# Patient Record
Sex: Female | Born: 1970 | Race: White | Hispanic: No | Marital: Married | Smoking: Never smoker
Health system: Southern US, Community
[De-identification: ages and names within clinical notes are randomized; demographics above are authoritative.]

## PROBLEM LIST (undated history)

## (undated) DIAGNOSIS — I639 Cerebral infarction, unspecified: Secondary | ICD-10-CM

## (undated) DIAGNOSIS — G4730 Sleep apnea, unspecified: Secondary | ICD-10-CM

## (undated) DIAGNOSIS — I10 Essential (primary) hypertension: Secondary | ICD-10-CM

---

## 2007-01-05 ENCOUNTER — Emergency Department: Payer: Self-pay | Admitting: Emergency Medicine

## 2013-11-19 HISTORY — PX: WRIST SURGERY: SHX841

## 2014-08-05 ENCOUNTER — Ambulatory Visit: Payer: Self-pay | Admitting: Internal Medicine

## 2014-11-06 ENCOUNTER — Emergency Department: Payer: Self-pay | Admitting: Emergency Medicine

## 2014-11-09 ENCOUNTER — Ambulatory Visit: Payer: Self-pay | Admitting: Orthopedic Surgery

## 2014-11-20 ENCOUNTER — Ambulatory Visit: Payer: Self-pay | Admitting: Internal Medicine

## 2015-03-12 NOTE — Op Note (Signed)
PATIENT NAME:  Rebecca Johnson, Rebecca Johnson MR#:  [id number] DATE OF BIRTH:  04/04/1971  DATE OF PROCEDURE:  11/09/2014  PREOPERATIVE DIAGNOSIS: Right distal radius fracture, displaced.   POSTOPERATIVE DIAGNOSIS: Left distal radius fracture, displaced.   PROCEDURE: Open reduction and internal fixation, right distal radius.   ANESTHESIA: General.   SURGEON: Laurene Footman, MD.  DESCRIPTION OF PROCEDURE: The patient was brought to the Operating Room and after adequate anesthesia was obtained, the right arm was prepped and draped in the usual sterile fashion with a tourniquet applied to the upper arm. The patient identification and timeout procedures were completed, the tourniquet was raised to 250 mmHg.    An incision was made over the FCR tendon. The tendon sheath was incised and the tendon retracted radially to protect the radial artery and veins. The pronator was then elevated off the distal radius fracture as well as shaft. Additionally, finger trap traction had been applied to restore length. With checking out and checking position of the fracture at this point, in length it was restored, volar tilt was not and so a distal first technique was utilized. The short, narrow DVR plate was applied to the  fracture fragments with a K wire into the distal fragment and initial single peg. After these were placed, the 3 cortical screws were placed, drilling and then placing three 10 mm cortical screws. This gave restoration of volar tilt.  The K wire was removed and remaining distal pegs filled, drilling, measuring and placing the smooth pegs. After all smooth pegs had been placed, traction was released under fluoroscopy.  The fracture was stable.  Radial inclination, volar tilt and length were all restored. The wound was thoroughly irrigated. Tourniquet was let down. The wound was closed with 3-0 Vicryl subcutaneously and 4-0 nylon for the skin. Xeroform, 4 x 4, Webril and Ace wrap were applied along with a volar splint.  The patient tolerated the procedure well.   ESTIMATED BLOOD LOSS: Minimal.   COMPLICATIONS: None.   SPECIMEN: None.   IMPLANT: [employer] short, narrow DVR plate with screws and pegs.   TOURNIQUET TIME:  28 minutes at 250 mmHg.    ____________________________ Laurene Footman, MD mjm:by D: 11/09/2014 20:24:06 ET T: 11/09/2014 21:51:23 ET JOB#: [id number]  cc: Laurene Footman, MD, <Dictator> Laurene Footman MD ELECTRONICALLY SIGNED 11/10/2014 8:17

## 2015-09-30 ENCOUNTER — Ambulatory Visit: Payer: 59 | Attending: Internal Medicine

## 2015-09-30 ENCOUNTER — Other Ambulatory Visit: Payer: Self-pay | Admitting: Obstetrics and Gynecology

## 2015-09-30 DIAGNOSIS — G4733 Obstructive sleep apnea (adult) (pediatric): Secondary | ICD-10-CM | POA: Insufficient documentation

## 2015-09-30 DIAGNOSIS — E669 Obesity, unspecified: Secondary | ICD-10-CM | POA: Diagnosis present

## 2015-09-30 DIAGNOSIS — R5383 Other fatigue: Secondary | ICD-10-CM | POA: Diagnosis present

## 2015-09-30 DIAGNOSIS — Z1231 Encounter for screening mammogram for malignant neoplasm of breast: Secondary | ICD-10-CM

## 2015-10-24 ENCOUNTER — Ambulatory Visit
Admission: RE | Admit: 2015-10-24 | Discharge: 2015-10-24 | Disposition: A | Discharge status: Home / Self Care | Payer: 59 | Source: Ambulatory Visit | Attending: Obstetrics and Gynecology | Admitting: Obstetrics and Gynecology

## 2015-10-24 DIAGNOSIS — Z1231 Encounter for screening mammogram for malignant neoplasm of breast: Secondary | ICD-10-CM | POA: Diagnosis not present

## 2015-11-18 ENCOUNTER — Ambulatory Visit: Payer: 59 | Attending: Internal Medicine

## 2015-11-18 DIAGNOSIS — G4733 Obstructive sleep apnea (adult) (pediatric): Secondary | ICD-10-CM | POA: Insufficient documentation

## 2016-02-01 ENCOUNTER — Ambulatory Visit
Admission: RE | Admit: 2016-02-01 | Discharge: 2016-02-01 | Disposition: A | Discharge status: Home / Self Care | Payer: 59 | Source: Ambulatory Visit | Attending: Unknown Physician Specialty | Admitting: Unknown Physician Specialty

## 2016-02-01 ENCOUNTER — Other Ambulatory Visit: Payer: Self-pay | Admitting: Unknown Physician Specialty

## 2016-02-01 DIAGNOSIS — G4459 Other complicated headache syndrome: Secondary | ICD-10-CM

## 2016-02-01 DIAGNOSIS — I639 Cerebral infarction, unspecified: Secondary | ICD-10-CM | POA: Diagnosis not present

## 2016-02-01 DIAGNOSIS — I63532 Cerebral infarction due to unspecified occlusion or stenosis of left posterior cerebral artery: Secondary | ICD-10-CM | POA: Diagnosis not present

## 2016-02-02 ENCOUNTER — Emergency Department: Payer: 59

## 2016-02-02 ENCOUNTER — Other Ambulatory Visit: Payer: Self-pay | Admitting: Physician Assistant

## 2016-02-02 ENCOUNTER — Inpatient Hospital Stay
Admission: EM | Admit: 2016-02-02 | Discharge: 2016-02-03 | DRG: 066 | Disposition: A | Discharge status: Home / Self Care | Payer: 59 | Attending: Internal Medicine | Admitting: Internal Medicine

## 2016-02-02 ENCOUNTER — Encounter: Payer: Self-pay | Admitting: Emergency Medicine

## 2016-02-02 DIAGNOSIS — H539 Unspecified visual disturbance: Secondary | ICD-10-CM

## 2016-02-02 DIAGNOSIS — I639 Cerebral infarction, unspecified: Secondary | ICD-10-CM

## 2016-02-02 DIAGNOSIS — H53461 Homonymous bilateral field defects, right side: Secondary | ICD-10-CM | POA: Diagnosis present

## 2016-02-02 DIAGNOSIS — I6350 Cerebral infarction due to unspecified occlusion or stenosis of unspecified cerebral artery: Secondary | ICD-10-CM | POA: Diagnosis not present

## 2016-02-02 DIAGNOSIS — G4733 Obstructive sleep apnea (adult) (pediatric): Secondary | ICD-10-CM | POA: Diagnosis present

## 2016-02-02 DIAGNOSIS — I10 Essential (primary) hypertension: Secondary | ICD-10-CM | POA: Diagnosis present

## 2016-02-02 DIAGNOSIS — Z79899 Other long term (current) drug therapy: Secondary | ICD-10-CM

## 2016-02-02 DIAGNOSIS — I63532 Cerebral infarction due to unspecified occlusion or stenosis of left posterior cerebral artery: Principal | ICD-10-CM | POA: Diagnosis present

## 2016-02-02 HISTORY — DX: Essential (primary) hypertension: I10

## 2016-02-02 HISTORY — DX: Sleep apnea, unspecified: G47.30

## 2016-02-02 LAB — COMPREHENSIVE METABOLIC PANEL
ALT: 12 U/L — ABNORMAL LOW (ref 14–54)
ANION GAP: 6 (ref 5–15)
AST: 13 U/L — ABNORMAL LOW (ref 15–41)
Albumin: 3.8 g/dL (ref 3.5–5.0)
Alkaline Phosphatase: 47 U/L (ref 38–126)
BUN: 10 mg/dL (ref 6–20)
CALCIUM: 8 mg/dL — AB (ref 8.9–10.3)
CHLORIDE: 109 mmol/L (ref 101–111)
CO2: 21 mmol/L — AB (ref 22–32)
Creatinine, Ser: 0.6 mg/dL (ref 0.44–1.00)
GFR calc non Af Amer: >60 mL/min (ref >60)
GLUCOSE: 94 mg/dL (ref 65–99)
Potassium: 3.9 mmol/L (ref 3.5–5.1)
SODIUM: 136 mmol/L (ref 135–145)
Total Bilirubin: 0.6 mg/dL (ref 0.3–1.2)
Total Protein: 6.8 g/dL (ref 6.5–8.1)

## 2016-02-02 LAB — CBC WITH DIFFERENTIAL/PLATELET
Basophils Absolute: 0.1 10*3/uL (ref 0–0.1)
Basophils Relative: 1 %
EOS ABS: 0.1 10*3/uL (ref 0–0.7)
EOS PCT: 1 %
HCT: 40.1 % (ref 35.0–47.0)
Hemoglobin: 13.4 g/dL (ref 12.0–16.0)
LYMPHS ABS: 3.6 10*3/uL (ref 1.0–3.6)
Lymphocytes Relative: 29 %
MCH: 29 pg (ref 26.0–34.0)
MCHC: 33.3 g/dL (ref 32.0–36.0)
MCV: 87.2 fL (ref 80.0–100.0)
MONO ABS: 0.8 10*3/uL (ref 0.2–0.9)
MONOS PCT: 6 %
Neutro Abs: 7.9 10*3/uL — ABNORMAL HIGH (ref 1.4–6.5)
Neutrophils Relative %: 63 %
PLATELETS: 317 10*3/uL (ref 150–440)
RBC: 4.6 MIL/uL (ref 3.80–5.20)
RDW: 14.3 % (ref 11.5–14.5)
WBC: 12.5 10*3/uL — AB (ref 3.6–11.0)

## 2016-02-02 MED ORDER — ASPIRIN 325 MG PO TABS
325.0000 mg | ORAL_TABLET | Freq: Every day | ORAL | Status: DC
  Administered 2016-02-03: 08:00:00 325 mg via ORAL
  Filled 2016-02-02: qty 1

## 2016-02-02 MED ORDER — ASPIRIN 81 MG PO CHEW
81.0000 mg | CHEWABLE_TABLET | Freq: Once | ORAL | Status: AC
  Administered 2016-02-02: 81 mg via ORAL
  Filled 2016-02-02: qty 1

## 2016-02-02 MED ORDER — LORAZEPAM 2 MG/ML IJ SOLN
1.0000 mg | Freq: Once | INTRAMUSCULAR | Status: AC
  Administered 2016-02-02: 1 mg via INTRAVENOUS

## 2016-02-02 MED ORDER — PROPRANOLOL HCL 40 MG PO TABS
40.0000 mg | ORAL_TABLET | Freq: Two times a day (BID) | ORAL | Status: DC
  Filled 2016-02-02: qty 1

## 2016-02-02 MED ORDER — ASPIRIN 300 MG RE SUPP
300.0000 mg | Freq: Every day | RECTAL | Status: DC
Start: 2016-02-02 — End: 2016-02-03

## 2016-02-02 MED ORDER — ATORVASTATIN CALCIUM 20 MG PO TABS
80.0000 mg | ORAL_TABLET | Freq: Every day | ORAL | Status: DC
  Administered 2016-02-03: 18:00:00 80 mg via ORAL
  Filled 2016-02-02: qty 4

## 2016-02-02 MED ORDER — LORAZEPAM 2 MG/ML IJ SOLN
INTRAMUSCULAR | Status: AC
  Filled 2016-02-02: qty 1

## 2016-02-02 MED ORDER — STROKE: EARLY STAGES OF RECOVERY BOOK
Freq: Once | Status: AC
  Administered 2016-02-02: 1

## 2016-02-02 NOTE — ED Notes (Signed)
Pt here after visiting PCP and eye MD today; pt with loss of peripheral vision since yesterday; pt denies it's getting worse. Pt's eyes are dilated from eye doctor, unsure if blurred vision is from dilation or new symptom. Pt reports small headache, reports she's stressed out and hasn't taken her medications today.

## 2016-02-02 NOTE — ED Notes (Signed)
Spoke with Dr Lavetta Nielsen regarding aspirin given in ER.  Stated to start 325 aspirin in AM.  Notified Kasey, RN on 1C.

## 2016-02-02 NOTE — ED Provider Notes (Addendum)
[facility] Emergency Department Provider Note   ____________________________________________  Time seen: Upon arrival to ED room I have reviewed the triage vital signs and the triage nursing note.  HISTORY  Chief Complaint Blurred Vision   Historian Patient  HPI Rebecca Johnson is a 45 y.o. female who is here for evaluation of peripheral field vision loss. Patient states yesterday she woke up and experienced right-sided peripheral vision loss. She went to urgent care when they opened around 9 AM and was sent for head CT which was read as negative. Patient was discharged home and made an appointment for ophthalmology this morning. Symptoms persisted and when she saw the ophthalmologist today, she had a dilated eye exam and there were apparently no concerning findings on that exam, other than peripheral vision field deficits bilaterally, reported by the patient. Patient was sent by ophthalmology to the emergency department for further evaluation for possibility of stroke.  Last normal would have been the evening of 01/31/16.  She is currently reporting blurry vision and sensitivity to light, and she has her eyes dilated.  No additional neurologic symptoms including no extremity weakness or numbness, slurred speech, coordination problems, sensory disturbances.    Past Medical History  Diagnosis Date  . Hypertension   . Sleep apnea     There are no active problems to display for this patient.   Past Surgical History  Procedure Laterality Date  . Wrist surgery Right 2015    No current outpatient prescriptions on file.  Allergies Review of patient's allergies indicates no known allergies.  Family History  Problem Relation Age of Onset  . Breast cancer Maternal Grandmother 80    Social History Social History  Substance Use Topics  . Smoking status: Never Smoker   . Smokeless tobacco: None  . Alcohol Use: No    Review of  Systems  Constitutional: Negative for fever. Eyes: As per history of present illness ENT: Negative for sore throat. Cardiovascular: Negative for chest pain. Respiratory: Negative for shortness of breath. Gastrointestinal: Negative for abdominal pain, vomiting and diarrhea. Genitourinary: Negative for dysuria. Musculoskeletal: Negative for back pain. Skin: Negative for rash. Neurological: Negative for headache. 10 point Review of Systems otherwise negative ____________________________________________   PHYSICAL EXAM:  VITAL SIGNS: ED Triage Vitals  Enc Vitals Group     BP 02/02/16 1614 212/113 mmHg     Pulse Rate 02/02/16 1614 77     Resp 02/02/16 1614 16     Temp 02/02/16 1614 97.6 F (36.4 C)     Temp Source 02/02/16 1614 Oral     SpO2 02/02/16 1614 99 %     Weight 02/02/16 1614 270 lb (122.471 kg)     Height 02/02/16 1614 5\' 2"  (1.575 m)     Head Cir --      Peak Flow --      Pain Score 02/02/16 1615 1     Pain Loc --      Pain Edu? --      Excl. in [place]? --      Constitutional: Alert and oriented. Well appearing and in no distress. HEENT   Head: Normocephalic and atraumatic.      Eyes: Conjunctivae are normal. Pupils are dilated bilaterally. Normal extraocular movements.      Ears:         Nose: No congestion/rhinnorhea.   Mouth/Throat: Mucous membranes are moist.   Neck: No stridor. Cardiovascular/Chest: Normal rate, regular rhythm.  No murmurs, rubs, or gallops. Respiratory:  Normal respiratory effort without tachypnea nor retractions. Breath sounds are clear and equal bilaterally. No wheezes/rales/rhonchi. Gastrointestinal: Soft. No distention, no guarding, no rebound. Nontender.    Genitourinary/rectal:Deferred Musculoskeletal: Nontender with normal range of motion in all extremities. No joint effusions.  No lower extremity tenderness.  No edema. Neurologic:  Normal speech and language. No facial droop. No aphasia. 5 out of 5 strength in 4 extremities.  No sensory deficit. No problems with coordination and normal gait. Skin:  Skin is warm, dry and intact. No rash noted. Psychiatric: Mood and affect are normal. Speech and behavior are normal. Patient exhibits appropriate insight and judgment.  ____________________________________________   EKG I, Lisa Roca, MD, the attending physician have personally viewed and interpreted all ECGs.  74 bpm. normal sinus rhythm. Nonspecific intraventricular conduction delay. Normal axis. Nonspecific T-wave ____________________________________________  LABS (pertinent positives/negatives)  White blood count 12.5, hemoglobin 13.4 and platelet count 317 and comprehensive metabolic panel without significant abnormality  ____________________________________________  RADIOLOGY All Xrays were viewed by me. Imaging interpreted by Radiologist.  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 __________________________________________  PROCEDURES  Procedure(s) performed: None  Critical Care performed: None  ____________________________________________   ED COURSE / ASSESSMENT AND PLAN  Pertinent labs & imaging results that were available during my care of the patient were reviewed by me and considered in my medical decision making (see chart for details).   Patient is here with bilateral peripheral vision field deficits.  She had a head CT which was read as negative yesterday, I will send her for MRI.  This is somewhat concerning for stroke, but she is outside any TPA window given onset was well over 24 hours ago.   MRI confirms a small stroke. Patient was given aspirin and I discussed this case with the hospitalist for admission.    CONSULTATIONS:   Hospitalist for admission   Patient / Family / Caregiver informed of clinical course, medical decision-making  process, and agree with plan.     ___________________________________________   FINAL CLINICAL IMPRESSION(S) / ED DIAGNOSES   Final diagnoses:  Cerebrovascular accident (CVA), unspecified mechanism ([place])              Note: This dictation was prepared with Sales executive. Any transcriptional errors that result from this process are unintentional   Lisa Roca, MD 02/02/16 1854  Lisa Roca, MD 02/02/16 [id number]  Lisa Roca, MD 02/02/16 1904

## 2016-02-02 NOTE — ED Notes (Signed)
Pt to mri 

## 2016-02-02 NOTE — H&P (Signed)
[facility] NAME: Rebecca Johnson    MR#:  [id number]  DATE OF BIRTH:  [birth year]   DATE OF ADMISSION:  02/02/2016  PRIMARY CARE PHYSICIAN: Kirk Ruths., MD   REQUESTING/REFERRING PHYSICIAN: Reita Cliche  CHIEF COMPLAINT:   Chief Complaint  Patient presents with  . Blurred Vision    HISTORY OF PRESENT ILLNESS:  Rebecca Johnson  is a 45 y.o. female with a known history of essential hypertension. She is presenting to the hospital with one-day duration of blurred vision. Last known normal approximately 7 AM 02/02/16. Went to urgent care and advised to follow with ophthalmology who felt that she had a stroke and sent to the Hospital further workup and evaluation. Where she indeed had MRI positive for stroke. She says since she has a right-sided vision loss most noticeable in her right eye denies any weakness or numbness. States her vision has somewhat improved since onset but still not at baseline  PAST MEDICAL HISTORY:   Past Medical History  Diagnosis Date  . Hypertension   . Sleep apnea     PAST SURGICAL HISTORY:   Past Surgical History  Procedure Laterality Date  . Wrist surgery Right 2015    SOCIAL HISTORY:   Social History  Substance Use Topics  . Smoking status: Never Smoker   . Smokeless tobacco: Not on file  . Alcohol Use: No    FAMILY HISTORY:   Family History  Problem Relation Age of Onset  . Breast cancer Maternal Grandmother 80    DRUG ALLERGIES:  No Known Allergies  REVIEW OF SYSTEMS:  REVIEW OF SYSTEMS:  CONSTITUTIONAL: Denies fevers, chills, fatigue, weakness.  EYES: Positive blurred vision, denies double vision, or eye pain.  EARS, NOSE, THROAT: Denies tinnitus, ear pain, hearing loss.  RESPIRATORY: denies cough, shortness of breath, wheezing  CARDIOVASCULAR: Denies chest pain, palpitations, edema.  GASTROINTESTINAL: Denies nausea, vomiting, diarrhea, abdominal pain.  GENITOURINARY:  Denies dysuria, hematuria.  ENDOCRINE: Denies nocturia or thyroid problems. HEMATOLOGIC AND LYMPHATIC: Denies easy bruising or bleeding.  SKIN: Denies rash or lesions.  MUSCULOSKELETAL: Denies pain in neck, back, shoulder, knees, hips, or further arthritic symptoms.  NEUROLOGIC: Denies paralysis, paresthesias.  PSYCHIATRIC: Denies anxiety or depressive symptoms. Otherwise full review of systems performed by me is negative.   MEDICATIONS AT HOME:   Prior to Admission medications   Medication Sig Start Date End Date Taking? Authorizing Provider  Butalbital-APAP-Caffeine 50-325-40 MG capsule Take 1 capsule by mouth every 4 (four) hours as needed.   Yes Historical Provider, MD  propranolol (INDERAL) 40 MG tablet Take 40 mg by mouth 2 (two) times daily.   Yes Historical Provider, MD      VITAL SIGNS:  Blood pressure 182/120, pulse 77, temperature 97.6 F (36.4 C), temperature source Oral, resp. rate 16, height 5\' 2"  (1.575 m), weight 270 lb (122.471 kg), last menstrual period 01/16/2016, SpO2 98 %.  PHYSICAL EXAMINATION:  VITAL SIGNS: Filed Vitals:   02/02/16 1614 02/02/16 1930  BP: 212/113 182/120  Pulse: 77   Temp: 97.6 F (36.4 C)   Resp: 16 16   GENERAL:44 y.o.female currently in no acute distress.  HEAD: Normocephalic, atraumatic.  EYES: Pupils equal, round, reactive to light. Extraocular muscles intact. No scleral icterus.  MOUTH: Moist mucosal membrane. Dentition intact. No abscess noted.  EAR, NOSE, THROAT: Clear without exudates. No external lesions.  NECK: Supple. No thyromegaly. No nodules. No JVD.  PULMONARY: Clear to ascultation, without wheeze  rails or rhonci. No use of accessory muscles, Good respiratory effort. good air entry bilaterally CHEST: Nontender to palpation.  CARDIOVASCULAR: S1 and S2. Regular rate and rhythm. No murmurs, rubs, or gallops. No edema. Pedal pulses 2+ bilaterally.  GASTROINTESTINAL: Soft, nontender, nondistended. No masses. Positive bowel  sounds. No hepatosplenomegaly.  MUSCULOSKELETAL: No swelling, clubbing, or edema. Range of motion full in all extremities.  NEUROLOGIC: Right-sided field visual deficit of both eyes, remainder of cranial nerves within normal limits, pronator drift within normal limits, strength 5/5 in all extremities including proximal/distal flexion/extension sensation intact reflexes intact gait deferred at this time SKIN: No ulceration, lesions, rashes, or cyanosis. Skin warm and dry. Turgor intact.  PSYCHIATRIC: Mood, affect within normal limits. The patient is awake, alert and oriented x 3. Insight, judgment intact.    LABORATORY PANEL:   CBC  Recent Labs Lab 02/02/16 1748  WBC 12.5*  HGB 13.4  HCT 40.1  PLT 317   ------------------------------------------------------------------------------------------------------------------  Chemistries   Recent Labs Lab 02/02/16 1748  NA 136  K 3.9  CL 109  CO2 21*  GLUCOSE 94  BUN 10  CREATININE 0.60  CALCIUM 8.0*  AST 13*  ALT 12*  ALKPHOS 47  BILITOT 0.6   ------------------------------------------------------------------------------------------------------------------  Cardiac Enzymes No results for input(s): TROPONINI in the last 168 hours. ------------------------------------------------------------------------------------------------------------------  RADIOLOGY:  Ct Head Wo Contrast  02/01/2016  CLINICAL DATA:  45 year old female with a history of headache with visual changes. EXAM: CT HEAD WITHOUT CONTRAST TECHNIQUE: Contiguous axial images were obtained from the base of the skull through the vertex without intravenous contrast. COMPARISON:  None. FINDINGS: Unremarkable appearance of the calvarium without acute fracture or aggressive lesion. Unremarkable appearance of the scalp soft tissues. Unremarkable appearance of the bilateral orbits. Mastoid air cells are clear. No significant paranasal sinus disease No acute intracranial  hemorrhage, midline shift, or mass effect. Gray-white differentiation is maintained, without CT evidence of acute ischemia. Unremarkable configuration of the ventricles. IMPRESSION: No CT evidence of acute intracranial abnormality. Signed, Dulcy Fanny. Earleen Newport, DO [facility] Electronically Signed   By: Corrie Mckusick D.O.   On: 02/01/2016 09:45   Mr Brain Wo Contrast (neuro Protocol)  02/02/2016  CLINICAL DATA:  Right eye visual loss for 1 day. Generalized headaches for a few years. EXAM: MRI HEAD WITHOUT CONTRAST TECHNIQUE: Multiplanar, multiecho pulse sequences of the brain and surrounding structures were obtained without intravenous contrast. COMPARISON:  Head CT 02/01/2016 FINDINGS: There is an acute infarct involving the mesial left temporal lobe white matter along the lateral ventricle which is approximately 3 x 1 cm in size. This involves the posterior choroidal artery territory. There is no evidence of associated hemorrhage. The hippocampus itself is normal in appearance. Ventricles and sulci are normal. 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 The brain is otherwise normal in signal. Orbits are unremarkable. Paranasal sinuses and mastoid air cells are clear. Major intracranial vascular flow voids are preserved. 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 Electronically Signed   By: Logan Bores M.D.   On: 02/02/2016 18:46    EKG:   Orders placed or performed during the hospital encounter of 02/02/16  . EKG 12-Lead  . EKG 12-Lead  . ED EKG  . ED EKG    IMPRESSION AND PLAN:   45 year old Caucasian female history of essential hypertension  presenting with vision loss  1.CVA, unspecified: Initiate aspirin/statin therapy, stroke  protocol/precautions, place on telemetry, trend cardiac enzymes, check noted positive MRI brain, complete echocardiogram, lipid panel, hemoglobin A1c, bilateral carotid Doppler, permissive hypertension first 24 hours treating blood pressure only greater than 220/120, will avoid heparin/Lovenox first 24 hours 2. Essential hypertension: Permissive hypertension as above 3. Venous thromboembolism prophylactic: SCDs overnight and switch to Lovenox in the morning     All the records are reviewed and case discussed with ED provider. Management plans discussed with the patient, family and they are in agreement.  CODE STATUS: Full  TOTAL TIME TAKING CARE OF THIS PATIENT: 33 minutes.    Hower,  Karenann Cai.D on 02/02/2016 at 8:16 PM  Between 7am to 6pm - Pager - [phone]  After 6pm: House Pager: - [facility]  Office  [phone]  CC: Primary care physician; Kirk Ruths., MD

## 2016-02-03 ENCOUNTER — Ambulatory Visit: Payer: 59

## 2016-02-03 ENCOUNTER — Inpatient Hospital Stay: Payer: 59

## 2016-02-03 ENCOUNTER — Inpatient Hospital Stay (HOSPITAL_COMMUNITY)
Admit: 2016-02-03 | Discharge: 2016-02-03 | Disposition: A | Discharge status: Home / Self Care | Payer: 59 | Attending: Internal Medicine | Admitting: Internal Medicine

## 2016-02-03 DIAGNOSIS — I6350 Cerebral infarction due to unspecified occlusion or stenosis of unspecified cerebral artery: Secondary | ICD-10-CM

## 2016-02-03 DIAGNOSIS — I639 Cerebral infarction, unspecified: Secondary | ICD-10-CM

## 2016-02-03 LAB — LIPID PANEL
CHOL/HDL RATIO: 3.6 ratio
Cholesterol: 157 mg/dL (ref 0–200)
HDL: 44 mg/dL (ref >40)
LDL CALC: 100 mg/dL — AB (ref 0–99)
Triglycerides: 66 mg/dL (ref <150)
VLDL: 13 mg/dL (ref 0–40)

## 2016-02-03 LAB — ECHOCARDIOGRAM COMPLETE
HEIGHTINCHES: 62 in
Weight: 4406.4 oz

## 2016-02-03 LAB — TSH: TSH: 1.636 u[IU]/mL (ref 0.350–4.500)

## 2016-02-03 LAB — SEDIMENTATION RATE: SED RATE: 9 mm/h (ref 0–20)

## 2016-02-03 LAB — HEMOGLOBIN A1C: Hgb A1c MFr Bld: 5.5 % (ref 4.0–6.0)

## 2016-02-03 MED ORDER — PROPRANOLOL HCL 40 MG PO TABS
40.0000 mg | ORAL_TABLET | ORAL | Status: AC
  Administered 2016-02-03: 40 mg via ORAL
  Filled 2016-02-03: qty 1

## 2016-02-03 MED ORDER — ENOXAPARIN SODIUM 40 MG/0.4ML ~~LOC~~ SOLN: 40.0000 mg | SUBCUTANEOUS | Status: DC

## 2016-02-03 MED ORDER — ASPIRIN 325 MG PO TABS: 325.0000 mg | ORAL_TABLET | Freq: Every day | ORAL | Status: AC

## 2016-02-03 MED ORDER — ATORVASTATIN CALCIUM 40 MG PO TABS: 40.0000 mg | ORAL_TABLET | Freq: Every day | ORAL | Status: AC

## 2016-02-03 MED ORDER — ASPIRIN EC 81 MG PO TBEC: 81.0000 mg | DELAYED_RELEASE_TABLET | Freq: Every day | ORAL | Status: DC

## 2016-02-03 MED ORDER — PERFLUTREN LIPID MICROSPHERE
INTRAVENOUS | Status: AC
  Administered 2016-02-03: 13:00:00
  Filled 2016-02-03: qty 10

## 2016-02-03 NOTE — Discharge Summary (Signed)
[facility] [place] NAME: Rebecca Johnson    MR#:  [id number]  DATE OF BIRTH:  [birth year]  DATE OF ADMISSION:  02/02/2016 ADMITTING PHYSICIAN: Rebecca Butte, MD  DATE OF DISCHARGE: 02/03/16  PRIMARY CARE PHYSICIAN: Rebecca Johnson., MD    ADMISSION DIAGNOSIS:  Cerebrovascular accident (CVA), unspecified mechanism ([place]) [I63.9]  DISCHARGE DIAGNOSIS:  Active Problems:   CVA (cerebral infarction)   SECONDARY DIAGNOSIS:   Past Medical History  Diagnosis Date  . Hypertension   . Sleep apnea     HOSPITAL COURSE:   [name] with PMH of HTN admitted with right homonymous hemoanopsia. MRI showing an acute infarct.  #1 Acute infarct- small acute left PCA infarct - symptoms improved, minimal right sided peripheral vision changes seen - seen by speech therapy and physical therapy and occupational therapy- and patient does not qualify for any rehab. -Possible workup has been sent. -Blood pressure is better controlled now. -Carotid Dopplers with no occlusion. Echocardiogram results are pending. If that is normal, cardiology appointment has been set up for outpatient TEE. -Started on aspirin and statin- LDL 100 -Outpatient neurology follow-up recommended. Outpatient ophthalmology follow-up as well.  #2 HTN- continue propranolol.   #3 obstructive sleep apnea-continue CPAP  Patient will be discharged home today  DISCHARGE CONDITIONS:   Stable  CONSULTS OBTAINED:  Treatment Team:  Rebecca Butte, MD Rebecca Hartshorn, MD  DRUG ALLERGIES:  No Known Allergies  DISCHARGE MEDICATIONS:   Current Discharge Medication List    START taking these medications   Details  aspirin EC 81 MG tablet Take 1 tablet (81 mg total) by mouth daily. Qty: 30 tablet, Refills: 2    atorvastatin (LIPITOR) 40 MG tablet Take 1 tablet (40 mg total) by mouth daily at 6 PM. Qty: 30 tablet, Refills: 2      CONTINUE these medications which have  NOT CHANGED   Details  Butalbital-APAP-Caffeine 50-325-40 MG capsule Take 1 capsule by mouth every 4 (four) hours as needed. Refills: 0    propranolol (INDERAL) 40 MG tablet Take 40 mg by mouth 2 (two) times daily.         DISCHARGE INSTRUCTIONS:   1. PCP f/u in 1-2 weeks 2. Cardiology f/u in 1 week for possible TEE 3. Neurology f/u in 3 weeks  If you experience worsening of your admission symptoms, develop shortness of breath, life threatening emergency, suicidal or homicidal thoughts you must seek medical attention immediately by calling 911 or calling your MD immediately  if symptoms less severe.  You Must read complete instructions/literature along with all the possible adverse reactions/side effects for all the Medicines you take and that have been prescribed to you. Take any new Medicines after you have completely understood and accept all the possible adverse reactions/side effects.   Please note  You were cared for by a hospitalist during your hospital stay. If you have any questions about your discharge medications or the care you received while you were in the hospital after you are discharged, you can call the unit and asked to speak with the hospitalist on call if the hospitalist that took care of you is not available. Once you are discharged, your primary care physician will handle any further medical issues. Please note that NO REFILLS for any discharge medications will be authorized once you are discharged, as it is imperative that you return to your primary care physician (or establish a relationship with a primary care physician if you  do not have one) for your aftercare needs so that they can reassess your need for medications and monitor your lab values.    Today   CHIEF COMPLAINT:   Chief Complaint  Patient presents with  . Blurred Vision     VITAL SIGNS:  Blood pressure 146/88, pulse 92, temperature 99.3 F (37.4 C), temperature source Oral, resp. rate 24,  height 5\' 2"  (1.575 m), weight 124.921 kg (275 lb 6.4 oz), last menstrual period 01/16/2016, SpO2 97 %.  I/O:   Intake/Output Summary (Last 24 hours) at 02/03/16 1553 Last data filed at 02/03/16 1300  Gross per 24 hour  Intake    480 ml  Output      0 ml  Net    480 ml    PHYSICAL EXAMINATION:   Physical Exam  GENERAL:  45 y.o.-year-old obese patient lying in the bed with no acute distress.  EYES: Pupils equal, round, reactive to light and accommodation. No scleral icterus. Extraocular muscles intact.  Right homonymous hemoanopsia HEENT: Head atraumatic, normocephalic. Oropharynx and nasopharynx clear.  NECK:  Supple, no jugular venous distention. No thyroid enlargement, no tenderness.  LUNGS: Normal breath sounds bilaterally, no wheezing, rales,rhonchi or crepitation. No use of accessory muscles of respiration.  CARDIOVASCULAR: S1, S2 normal. No murmurs, rubs, or gallops.  ABDOMEN: Soft, non-tender, non-distended. Bowel sounds present. No organomegaly or mass.  EXTREMITIES: No pedal edema, cyanosis, or clubbing.  NEUROLOGIC: Cranial nerves II through XII are intact. Muscle strength 5/5 in all extremities. Sensation intact. Gait not checked.  PSYCHIATRIC: The patient is alert and oriented x 3.  SKIN: No obvious rash, lesion, or ulcer.   DATA REVIEW:   CBC  Recent Labs Lab 02/02/16 1748  WBC 12.5*  HGB 13.4  HCT 40.1  PLT 317    Chemistries   Recent Labs Lab 02/02/16 1748  NA 136  K 3.9  CL 109  CO2 21*  GLUCOSE 94  BUN 10  CREATININE 0.60  CALCIUM 8.0*  AST 13*  ALT 12*  ALKPHOS 47  BILITOT 0.6    Cardiac Enzymes No results for input(s): TROPONINI in the last 168 hours.  Microbiology Results  No results found for this or any previous visit.  RADIOLOGY:  Mr Brain Wo Contrast (neuro Protocol)  02/02/2016  CLINICAL DATA:  Right eye visual loss for 1 day. Generalized headaches for a few years. EXAM: MRI HEAD WITHOUT CONTRAST TECHNIQUE: Multiplanar,  multiecho pulse sequences of the brain and surrounding structures were obtained without intravenous contrast. COMPARISON:  Head CT 02/01/2016 FINDINGS: There is an acute infarct involving the mesial left temporal lobe white matter along the lateral ventricle which is approximately 3 x 1 cm in size. This involves the posterior choroidal artery territory. There is no evidence of associated hemorrhage. The hippocampus itself is normal in appearance. Ventricles and sulci are normal. 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 The brain is otherwise normal in signal. Orbits are unremarkable. Paranasal sinuses and mastoid air cells are clear. Major intracranial vascular flow voids are preserved. 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 Electronically Signed   By: Logan Bores M.D.   On: 02/02/2016 18:46   US Carotid Bilateral  02/03/2016  CLINICAL DATA:  44 year old female with a small acute left PCA territory stroke. EXAM: BILATERAL CAROTID DUPLEX ULTRASOUND TECHNIQUE: Pearline Cables scale imaging, color Doppler and  duplex ultrasound were performed of bilateral carotid and vertebral arteries in the neck. COMPARISON:  Brain MRI 02/02/2016 FINDINGS: Criteria: Quantification of carotid stenosis is based on velocity parameters that correlate the residual internal carotid diameter with NASCET-based stenosis levels, using the diameter of the distal internal carotid lumen as the denominator for stenosis measurement. The following velocity measurements were obtained: RIGHT ICA:  104/42 cm/sec CCA:  99991111 cm/sec SYSTOLIC ICA/CCA RATIO:  1.1 DIASTOLIC ICA/CCA RATIO:  1.9 ECA:  103 cm/sec LEFT ICA:  94/37 cm/sec CCA:  [id number] cm/sec SYSTOLIC ICA/CCA RATIO:  1.1 DIASTOLIC ICA/CCA RATIO:  1.7 ECA:  77 cm/sec RIGHT CAROTID ARTERY:  He tortuous internal carotid anatomy. No significant atherosclerotic plaque or evidence of stenosis. RIGHT VERTEBRAL ARTERY:  Patent with normal antegrade flow. LEFT CAROTID ARTERY: No significant atherosclerotic plaque or evidence of stenosis. LEFT VERTEBRAL ARTERY:  Patent with normal antegrade flow. Minimally prominent carotid lymph nodes bilaterally. IMPRESSION: Negative bilateral carotid duplex ultrasound. Signed, Criselda Peaches, MD [facility] Electronically Signed   By: Jacqulynn Cadet M.D.   On: 02/03/2016 12:43    EKG:   Orders placed or performed during the hospital encounter of 02/02/16  . EKG 12-Lead  . EKG 12-Lead  . ED EKG  . ED EKG      Management plans discussed with the patient, family and they are in agreement.  CODE STATUS:     Code Status Orders        Start     Ordered   02/02/16 1933  Full code   Continuous     02/02/16 1933    Code Status History    Date Active Date Inactive Code Status Order ID Comments User Context   This patient has a current code status but no historical code status.      TOTAL TIME TAKING CARE OF THIS PATIENT: 37 minutes.    Gladstone Lighter M.D on 02/03/2016 at 3:53 PM  Between 7am to 6pm - Pager - [phone]  After 6pm go to [email or web address] - password [email or web address] [facility]  Office  [phone]  CC: Primary care physician; Rebecca Johnson., MD

## 2016-02-03 NOTE — Progress Notes (Signed)
To whom it may concern, Ms Rebecca Johnson was under medical care at [facility] 02/02/2016 --02/03/2016 and should be excused from work/obligations for that time period. She may return to work 02/05/16 Please call for concerns/questions Regino Schultze MD [phone]

## 2016-02-03 NOTE — Progress Notes (Signed)
*  PRELIMINARY RESULTS* Echocardiogram 2D Echocardiogram has been performed.  Rebecca Johnson 02/03/2016, 9:33 AM

## 2016-02-03 NOTE — Evaluation (Signed)
Occupational Therapy Evaluation Patient Details Name: Rebecca Johnson MRN: [id number] DOB: [birth year] Today's Date: 02/03/2016    History of Present Illness Rebecca Johnson is a 45 y.o. female with a known history of essential hypertension. She is presenting to the hospital with one-day duration of blurred vision. Last known normal approximately 7 AM 02/02/16. Went to urgent care and advised to follow with ophthalmology who felt that she had a stroke and sent to the Hospital further workup and evaluation. Where she indeed had MRI positive for stroke. She says since she has a right-sided vision loss most noticeable in her right eye denies any weakness or numbness. States her vision has somewhat improved since onset but still not at baseline   Clinical Impression   Pt presents with decreased convergence of R eye, blurry vision and decreased smoothness of tracking to upper and lower quadrant in R eye.  Pt is at baseline for ADLs but would benefit from continued therapy to address vision deficits to increase safety with functional mobility and prevent falls.  Concerned that patient was driving and working at [employer] prior to stroke and will need to do reading and writing tasks.  Rec outpatient OT to address vision deficits.     Follow Up Recommendations  Outpatient OT    Equipment Recommendations       Recommendations for Other Services       Precautions / Restrictions        Mobility Bed Mobility                  Transfers                      Balance                                            ADL Overall ADL's : At baseline                                       General ADL Comments: Pt has specific vision deficits mainly with convergence and focusing with hx of peripheral vision deficits at opthamology priro to coming to hosptial.  Independent in self care skills and tolieting and no LOB.     Vision Vision Assessment?:  Yes Eye Alignment: Within Functional Limits Ocular Range of Motion: Within Functional Limits Alignment/Gaze Preference: Within Defined Limits Tracking/Visual Pursuits: Decreased smoothness of eye movement to RIGHT inferior field;Decreased smoothness of eye movement to RIGHT superior field Saccades: Within functional limits Convergence: Impaired (comment) Visual Fields: Other (comment) (pt did not demonstrate a field cut on R as previously assessed by opthamology but has decreased smoothness of mv to R upper and lower quadrant.  ) Depth Perception: Overshoots   Perception Perception Perception Tested?: No   Praxis Praxis Praxis tested?: Within functional limits    Pertinent Vitals/Pain Pain Assessment: No/denies pain     Hand Dominance Right   Extremity/Trunk Assessment Upper Extremity Assessment Upper Extremity Assessment: Overall WFL for tasks assessed   Lower Extremity Assessment Lower Extremity Assessment: Defer to PT evaluation       Communication Communication Communication: No difficulties   Cognition Arousal/Alertness: Awake/alert Behavior During Therapy: WFL for tasks assessed/performed Overall Cognitive Status: Within Functional Limits for tasks assessed  General Comments       Exercises       Shoulder Instructions      Home Living Family/patient expects to be discharged to:: Private residence Living Arrangements: Spouse/significant other Available Help at Discharge: Family Type of Home: House Home Access: Stairs to enter Technical brewer of Steps: 5 Entrance Stairs-Rails: Left Home Layout: One level     Bathroom Shower/Tub: Tub/shower unit Shower/tub characteristics: Architectural technologist: Programmer, systems: No   Home Equipment: None          Prior Functioning/Environment Level of Independence: Independent        Comments: independent in all ADLs and working at [employer]    OT  Diagnosis:     OT Problem List: Impaired vision/perception   OT Treatment/Interventions: Patient/family education;Visual/perceptual remediation/compensation    OT Goals(Current goals can be found in the care plan section) Acute Rehab OT Goals Patient Stated Goal: "to get out of this bed" OT Goal Formulation: With patient/family Time For Goal Achievement: 02/17/16 Potential to Achieve Goals: Good  OT Frequency: Min 1X/week   Barriers to D/C:            Co-evaluation              End of Session Equipment Utilized During Treatment: Gait belt  Activity Tolerance: Patient tolerated treatment well Patient left: in bed;with family/visitor present;Other (comment) (Pt arrived for eval)   Time: 1000-1042 OT Time Calculation (min): 42 min Charges:  OT General Charges $OT Visit: 1 Procedure OT Evaluation $OT Eval Moderate Complexity: 1 Procedure OT Treatments $Therapeutic Activity: 23-37 mins G-Codes:    Wofford,Susan 02-29-2016, 11:41 AM   Chrys Racer, OTR/L ascom [phone]

## 2016-02-03 NOTE — Consult Note (Signed)
Referring Physician: Tressia Miners    Chief Complaint: Vision changes  HPI: Rebecca Johnson is an 45 y.o. female who reports going to bed normal on Tuesday night.  Awakened on Wednesday and noticed that her vision was bad to the right.  At that time she thought that she was about to get a migraine.  When she never got a severe headchae and her symptoms continued she went to the ophthalmologist who referred her to the ED for evaluation for stroke. Initial NIHSS of 1.  Patient reports vision has improved some today but she continues to have some decrease in her peripheral vision.    Date last known well: 01/31/2016 Time last known well: Time: 23:00 tPA Given: No: Outside treatment window  MRankin: 0  Past Medical History  Diagnosis Date  . Hypertension   . Sleep apnea     Past Surgical History  Procedure Laterality Date  . Wrist surgery Right 2015    Family History  Problem Relation Age of Onset  . Breast cancer Maternal Grandmother 80  No history of stroke or MI at an early age.  No history of miscarriages.    Social History:  reports that she has never smoked. She does not have any smokeless tobacco history on file. She reports that she does not drink alcohol. Her drug history is not on file.  Allergies: No Known Allergies  Medications:  I have reviewed the patient's current medications. Prior to Admission:  Prescriptions prior to admission  Medication Sig Dispense Refill Last Dose  . Butalbital-APAP-Caffeine 50-325-40 MG capsule Take 1 capsule by mouth every 4 (four) hours as needed.  0 prn at prn  . propranolol (INDERAL) 40 MG tablet Take 40 mg by mouth 2 (two) times daily.   02/01/2016 at Unknown time   Scheduled: . aspirin  325 mg Oral Daily  . atorvastatin  80 mg Oral q1800  . propranolol  40 mg Oral BID    ROS: History obtained from the patient  General ROS: negative for - chills, fatigue, fever, night sweats, weight gain or weight loss Psychological ROS:  negative for - behavioral disorder, hallucinations, memory difficulties, mood swings or suicidal ideation Ophthalmic ROS: negative for - blurry vision, double vision, eye pain or loss of vision ENT ROS: negative for - epistaxis, nasal discharge, oral lesions, sore throat, tinnitus or vertigo Allergy and Immunology ROS: negative for - hives or itchy/watery eyes Hematological and Lymphatic ROS: negative for - bleeding problems, bruising or swollen lymph nodes Endocrine ROS: negative for - galactorrhea, hair pattern changes, polydipsia/polyuria or temperature intolerance Respiratory ROS: negative for - cough, hemoptysis, shortness of breath or wheezing Cardiovascular ROS: hand swelling Gastrointestinal ROS: negative for - abdominal pain, diarrhea, hematemesis, nausea/vomiting or stool incontinence Genito-Urinary ROS: negative for - dysuria, hematuria, incontinence or urinary frequency/urgency Musculoskeletal ROS: negative for - joint swelling or muscular weakness Neurological ROS: as noted in HPI Dermatological ROS: negative for rash and skin lesion changes  Physical Examination: Blood pressure 146/104, pulse 89, temperature 98.3 F (36.8 C), temperature source Oral, resp. rate 24, height '5\' 2"'  (1.575 m), weight 124.921 kg (275 lb 6.4 oz), last menstrual period 01/16/2016, SpO2 94 %.  Gen: NAD. Obese. HEENT-  Normocephalic, no lesions, without obvious abnormality.  Normal external eye and conjunctiva.  Normal TM's bilaterally.  Normal auditory canals and external ears. Normal external nose, mucus membranes and septum.  Normal pharynx. Cardiovascular- S1, S2 normal, pulses palpable throughout   Lungs- chest clear, no wheezing, rales, normal  symmetric air entry Abdomen- soft, non-tender; bowel sounds normal; no masses,  no organomegaly Extremities- mild edema Lymph-no adenopathy palpable Musculoskeletal-no joint tenderness, deformity or swelling Skin-warm and dry, no hyperpigmentation, vitiligo,  or suspicious lesions  Neurological Examination Mental Status: Alert, oriented, thought content appropriate.  Speech fluent without evidence of aphasia.  Able to follow 3 step commands without difficulty. Cranial Nerves: II: Discs flat bilaterally; Decrease in right peripheral vision loss, pupils equal, round, reactive to light and accommodation III,IV, VI: ptosis not present, extra-ocular motions intact bilaterally V,VII: smile symmetric, facial light touch sensation normal bilaterally VIII: hearing normal bilaterally IX,X: gag reflex present XI: bilateral shoulder shrug XII: midline tongue extension Motor: Right : Upper extremity   5/5    Left:     Upper extremity   5/5  Lower extremity   5/5     Lower extremity   5/5 Tone and bulk:normal tone throughout; no atrophy noted Sensory: Pinprick and light touch intact throughout, bilaterally Deep Tendon Reflexes: 2+ in the upper extremities, 1+ at the knees and absent at the ankles.   Plantars: Right: downgoing   Left: downgoing Cerebellar: Normal finger-to-nose and normal heel-to-shin testing bilaterally Gait: normal gait and station      Laboratory Studies:  Basic Metabolic Panel:  Recent Labs Lab 02/02/16 1748  NA 136  K 3.9  CL 109  CO2 21*  GLUCOSE 94  BUN 10  CREATININE 0.60  CALCIUM 8.0*    Liver Function Tests:  Recent Labs Lab 02/02/16 1748  AST 13*  ALT 12*  ALKPHOS 47  BILITOT 0.6  PROT 6.8  ALBUMIN 3.8   No results for input(s): LIPASE, AMYLASE in the last 168 hours. No results for input(s): AMMONIA in the last 168 hours.  CBC:  Recent Labs Lab 02/02/16 1748  WBC 12.5*  NEUTROABS 7.9*  HGB 13.4  HCT 40.1  MCV 87.2  PLT 317    Cardiac Enzymes: No results for input(s): CKTOTAL, CKMB, CKMBINDEX, TROPONINI in the last 168 hours.  BNP: Invalid input(s): POCBNP  CBG: No results for input(s): GLUCAP in the last 168 hours.  Microbiology: No results found for this or any previous  visit.  Coagulation Studies: No results for input(s): LABPROT, INR in the last 72 hours.  Urinalysis: No results for input(s): COLORURINE, LABSPEC, PHURINE, GLUCOSEU, HGBUR, BILIRUBINUR, KETONESUR, PROTEINUR, UROBILINOGEN, NITRITE, LEUKOCYTESUR in the last 168 hours.  Invalid input(s): APPERANCEUR  Lipid Panel:    Component Value Date/Time   CHOL 157 02/03/2016 0415   TRIG 66 02/03/2016 0415   HDL 44 02/03/2016 0415   CHOLHDL 3.6 02/03/2016 0415   VLDL 13 02/03/2016 0415   LDLCALC 100* 02/03/2016 0415    HgbA1C: No results found for: HGBA1C  Urine Drug Screen:  No results found for: LABOPIA, COCAINSCRNUR, LABBENZ, AMPHETMU, THCU, LABBARB  Alcohol Level: No results for input(s): ETH in the last 168 hours.  Other results: EKG: sinus rhythm at 74 bpm.  Imaging: Mr Brain Wo Contrast (neuro Protocol)  02/02/2016  CLINICAL DATA:  Right eye visual loss for 1 day. Generalized headaches for a few years. EXAM: MRI HEAD WITHOUT CONTRAST TECHNIQUE: Multiplanar, multiecho pulse sequences of the brain and surrounding structures were obtained without intravenous contrast. COMPARISON:  Head CT 02/01/2016 FINDINGS: There is an acute infarct involving the mesial left temporal lobe white matter along the lateral ventricle which is approximately 3 x 1 cm in size. This involves the posterior choroidal artery territory. There is no evidence of associated hemorrhage. The  hippocampus itself is normal in appearance. Ventricles and sulci are normal. 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 The brain is otherwise normal in signal. Orbits are unremarkable. Paranasal sinuses and mastoid air cells are clear. Major intracranial vascular flow voids are preserved. 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  Electronically Signed   By: Logan Bores M.D.   On: 02/02/2016 18:46   US Carotid Bilateral  02/03/2016  CLINICAL DATA:  45 year old female with a small acute left PCA territory stroke. EXAM: BILATERAL CAROTID DUPLEX ULTRASOUND TECHNIQUE: Pearline Cables scale imaging, color Doppler and duplex ultrasound were performed of bilateral carotid and vertebral arteries in the neck. COMPARISON:  Brain MRI 02/02/2016 FINDINGS: Criteria: Quantification of carotid stenosis is based on velocity parameters that correlate the residual internal carotid diameter with NASCET-based stenosis levels, using the diameter of the distal internal carotid lumen as the denominator for stenosis measurement. The following velocity measurements were obtained: RIGHT ICA:  104/42 cm/sec CCA:  23/76 cm/sec SYSTOLIC ICA/CCA RATIO:  1.1 DIASTOLIC ICA/CCA RATIO:  1.9 ECA:  103 cm/sec LEFT ICA:  94/37 cm/sec CCA:  28/31 cm/sec SYSTOLIC ICA/CCA RATIO:  1.1 DIASTOLIC ICA/CCA RATIO:  1.7 ECA:  77 cm/sec RIGHT CAROTID ARTERY: He tortuous internal carotid anatomy. No significant atherosclerotic plaque or evidence of stenosis. RIGHT VERTEBRAL ARTERY:  Patent with normal antegrade flow. LEFT CAROTID ARTERY: No significant atherosclerotic plaque or evidence of stenosis. LEFT VERTEBRAL ARTERY:  Patent with normal antegrade flow. Minimally prominent carotid lymph nodes bilaterally. IMPRESSION: Negative bilateral carotid duplex ultrasound. Signed, Criselda Peaches, MD [facility] Electronically Signed   By: Jacqulynn Cadet M.D.   On: 02/03/2016 12:43    Assessment: 45 y.o. female presenting with visual changes.  Per ophthalmology evaluation patient noted to have a [facility].  On examination today patient with just some right peripheral vision loss bilaterally.  MRI of the brain personally reviewed and shows a small acute left PCA infarct.  Carotid dopplers are unremarkable.  Echocardiogram is pending.  LDL is  100.  A1c is pending.  Patient on no antiplatelet therapy at home.    Stroke Risk Factors - hyperlipidemia, hypertension and sleep apnea  Plan: 1. Echocardiogram results pending.  If unremarkable patient to have a TEE.  May be performed on an outpatient basis.  Cardiology to be made aware.   2. Prophylactic therapy-Antiplatelet med: Aspirin - dose 330m daily 3. Telemetry monitoring 4. Frequent neuro checks 5. Statin treatment with goal LDL less than 70.   6. Blood work to be drawn for possible hypercoagulable state-ESR, ANA, protein S, protein C, antithrombin III, lupus anticoagulant, factor V, TSH 7. Patient to continue CPAP use at night 8. Patient to follow up with neurology and ophthalmology on an outpatient basis 9. Restart antihypertensive for BP control    Case discussed with Dr. KSamuella Cota MD Neurology [phone]/17/2017, 2:33 PM

## 2016-02-03 NOTE — Evaluation (Signed)
Physical Therapy Evaluation Patient Details Name: Rebecca Johnson MRN: [id number] DOB: [birth year] Today's Date: 02/03/2016   History of Present Illness  Rebecca Johnson is a 45 y.o. female with a known history of essential hypertension. She is presenting to the hospital with one-day duration of blurred vision. Last known normal approximately 7 AM 02/02/16. Went to urgent care and advised to follow with ophthalmology who felt that she had a stroke and sent to the Hospital further workup and evaluation. Where she indeed had MRI positive for stroke. She says since she has a right-sided vision loss most noticeable in her right eye denies any weakness or numbness. States her vision has somewhat improved since onset but still not at baseline  Clinical Impression  Regarding PT pt has no issues showing good balance with eyes open and closed and with NBOS, SLS >10 seconds and good confidence with ambulation.  She has no focal weakness and generally reports feeling at her baseline except for R visual field issues.  Pt has abnormal saccades in R eye with tracking and has some peripheral vision issues, but these do not seem to effect her function or safety.  Pt does not need further PT intervention at this time.     Follow Up Recommendations No PT follow up    Equipment Recommendations       Recommendations for Other Services       Precautions / Restrictions Restrictions Weight Bearing Restrictions: No      Mobility  Bed Mobility Overal bed mobility: Independent                Transfers Overall transfer level: Independent                  Ambulation/Gait Ambulation/Gait assistance: Independent Ambulation Distance (Feet): 250 Feet Assistive device: None       General Gait Details: Pt with no hesiation with ambulation and no safety concerns.  She is able to stop, turn, and do some head turning and nodding w/o issue.  She shows good confidence and reports that except for having  some issues with her R sided vision feels she is walking at her baseline.    Stairs Stairs: Yes Stairs assistance: Independent Stair Management: One rail Left Number of Stairs: 6 General stair comments: Pt able to confidently negotiate up/down steps reciprocally and without issue.  Wheelchair Mobility    Modified Rankin (Stroke Patients Only)       Balance                                             Pertinent Vitals/Pain Pain Assessment: No/denies pain    Home Living Family/patient expects to be discharged to:: Private residence Living Arrangements: Spouse/significant other Available Help at Discharge: Family Type of Home: House Home Access: Stairs to enter Entrance Stairs-Rails: Left Entrance Stairs-Number of Steps: 5 Home Layout: One level Home Equipment: None      Prior Function Level of Independence: Independent         Comments: independent in all ADLs and working at [place]: Right    Extremity/Trunk Assessment   Upper Extremity Assessment: Overall WFL for tasks assessed           Lower Extremity Assessment: Overall WFL for tasks assessed         Communication  Communication: No difficulties  Cognition Arousal/Alertness: Awake/alert Behavior During Therapy: WFL for tasks assessed/performed Overall Cognitive Status: Within Functional Limits for tasks assessed                      General Comments      Exercises        Assessment/Plan    PT Assessment Patent does not need any further PT services  PT Diagnosis Generalized weakness   PT Problem List    PT Treatment Interventions     PT Goals (Current goals can be found in the Care Plan section) Acute Rehab PT Goals Patient Stated Goal: Get back to her normal PT Goal Formulation: With patient/family    Frequency     Barriers to discharge        Co-evaluation               End of Session Equipment  Utilized During Treatment: Gait belt Activity Tolerance: Patient tolerated treatment well Patient left: in chair;with family/visitor present Nurse Communication: Mobility status         Time: 1055-1110 PT Time Calculation (min) (ACUTE ONLY): 15 min   Charges:   PT Evaluation $PT Eval Low Complexity: 1 Procedure     PT G Codes:       Wayne Both, PT, DPT [phone]  Kreg Shropshire 02/03/2016, 1:56 PM

## 2016-02-03 NOTE — Progress Notes (Signed)
Received order for SLP services. Chart reviewed, spoke to Pt and Nsg. 45 YO femaile admitted for CVA. No speech, language or swallowing deficits noted by Pt, husband or SLP. No ST eval indicated at this time. Pt is on a heart healthy diet and consumed most of lunch. Observed by ST with sips of thin and a bite of solid with no s/s of aspiration. Please notify ST if any speech or swallowing deficits arise. No further ST follow up at this time.

## 2016-02-04 LAB — ANTITHROMBIN III: AntiThromb III Func: 109 % (ref 75–120)

## 2016-02-06 ENCOUNTER — Telehealth: Payer: Self-pay | Admitting: Cardiology

## 2016-02-06 NOTE — Telephone Encounter (Signed)
Patient coming in to see Dr. Yvone Neu tomorrow 02/07/16 because of her recent visit to the ED and stroke. She had no questions at this time and will be in tomorrow.

## 2016-02-06 NOTE — Telephone Encounter (Signed)
Pt calling stating she was seen in ED on 02/02/16 Pt is coming tomorrow 02/07/16 to see Dr Yvone Neu

## 2016-02-07 ENCOUNTER — Ambulatory Visit (INDEPENDENT_AMBULATORY_CARE_PROVIDER_SITE_OTHER): Payer: 59 | Admitting: Cardiology

## 2016-02-07 ENCOUNTER — Encounter: Payer: Self-pay | Admitting: Cardiology

## 2016-02-07 VITALS — BP 158/114 | HR 64 | Ht 62.0 in | Wt 272.1 lb

## 2016-02-07 DIAGNOSIS — G4733 Obstructive sleep apnea (adult) (pediatric): Secondary | ICD-10-CM | POA: Diagnosis not present

## 2016-02-07 DIAGNOSIS — I639 Cerebral infarction, unspecified: Secondary | ICD-10-CM | POA: Diagnosis not present

## 2016-02-07 DIAGNOSIS — I10 Essential (primary) hypertension: Secondary | ICD-10-CM

## 2016-02-07 DIAGNOSIS — R931 Abnormal findings on diagnostic imaging of heart and coronary circulation: Secondary | ICD-10-CM

## 2016-02-07 MED ORDER — AMLODIPINE BESYLATE 5 MG PO TABS: 5.0000 mg | ORAL_TABLET | Freq: Every day | ORAL | Status: DC

## 2016-02-07 MED ORDER — METOPROLOL TARTRATE 25 MG PO TABS: 25.0000 mg | ORAL_TABLET | Freq: Two times a day (BID) | ORAL | Status: DC

## 2016-02-07 NOTE — Progress Notes (Signed)
Cardiology Office Note   Date:  02/09/2016   ID:  [name], DOB 05/04/1971, MRN [id number]  Referring Doctor:  Kirk Ruths., MD   Cardiologist:   Wende Bushy, MD   Reason for consultation:  Chief Complaint  Patient presents with  . Follow-up    no chest pain, no sob no swelling  . Decreased Visual Acuity  . Hypertension   Evaluation post CVA, TEE requested by Neurology   History of Present Illness: Rebecca Johnson is a 45 y.o. female who presents for evaluation post CVA, TEE requested by Neurology.  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 Echo was done, difficult study but no emboli noted. TEE was then requested.  Per pt, difficulty with vision improved but with persistent issues with peripheral vision as well as depth perception, subjectively.  Pt denies CP, SOB, palpitations, fever, cough, clds, headache, abdominal pain, edema, PND, orthopnea.  In terms of her hypertension, she was not seeing MD regularly and not checking BP. Has been aware of elevated BP for awhile. Recently started on meds only in jan 2017. BP was noted to be 212/113 on admission with CVA.   ROS:  Please see the history of present illness. Aside from mentioned under HPI, all other systems are reviewed and negative.     Past Medical History  Diagnosis Date  . Hypertension   . Sleep apnea     Past Surgical History  Procedure Laterality Date  . Wrist surgery Right 2015     reports that she has never smoked. She does not have any smokeless tobacco history on file. She reports that she does not drink alcohol.   family history includes Atrial fibrillation in her maternal grandmother; Breast cancer (age of onset: 61) in her maternal grandmother.   Current Outpatient Prescriptions  Medication Sig Dispense Refill  . aspirin 325 MG tablet Take 1 tablet  (325 mg total) by mouth daily. 30 tablet 2  . atorvastatin (LIPITOR) 40 MG tablet Take 1 tablet (40 mg total) by mouth daily at 6 PM. 30 tablet 2  . amLODipine (NORVASC) 5 MG tablet Take 1 tablet (5 mg total) by mouth daily. 30 tablet 6  . metoprolol tartrate (LOPRESSOR) 25 MG tablet Take 1 tablet (25 mg total) by mouth 2 (two) times daily. 60 tablet 6   No current facility-administered medications for this visit.    Allergies: Review of patient's allergies indicates no known allergies.    PHYSICAL EXAM: VS:  BP 158/114 mmHg  Pulse 64  Ht 5\' 2"  (1.575 m)  Wt 272 lb 1.9 oz (123.433 kg)  BMI 49.76 kg/m2  LMP 01/12/2016 (Within Days) , Body mass index is 49.76 kg/(m^2). Wt Readings from Last 3 Encounters:  02/07/16 272 lb 1.9 oz (123.433 kg)  02/02/16 275 lb 6.4 oz (124.921 kg)    GENERAL:  well developed, well nourished, morbidly obese, not in acute distress HEENT: normocephalic, pink conjunctivae, anicteric sclerae, no xanthelasma, normal dentition, oropharynx clear NECK:  no neck vein engorgement, JVP normal, no hepatojugular reflux, carotid upstroke brisk and symmetric, no bruit, no thyromegaly, no lymphadenopathy LUNGS:  good respiratory effort, clear to auscultation bilaterally CV:  PMI not displaced, no thrills, no lifts, S1 and S2 within normal limits, no palpable S3 or S4, no murmurs, no rubs, no gallops ABD:  Soft, nontender, nondistended, normoactive bowel sounds, no abdominal aortic  bruit, no hepatomegaly, no splenomegaly, striae noted/appearance of stretch marks MS: nontender back, no kyphosis, no scoliosis, no joint deformities EXT:  2+ DP/PT pulses, no edema, no varicosities, no cyanosis, no clubbing SKIN: warm, nondiaphoretic, normal turgor, no ulcers NEUROPSYCH: alert, oriented to person, place, and time, sensory/motor grossly intact, normal mood, appropriate affect  Recent Labs: 02/02/2016: ALT 12*; BUN 10; Creatinine, Ser 0.60; Hemoglobin 13.4; Platelets 317;  Potassium 3.9; Sodium 136 02/03/2016: TSH 1.636   Lipid Panel    Component Value Date/Time   CHOL 157 02/03/2016 0415   TRIG 66 02/03/2016 0415   HDL 44 02/03/2016 0415   CHOLHDL 3.6 02/03/2016 0415   VLDL 13 02/03/2016 0415   LDLCALC 100* 02/03/2016 0415     Other studies Reviewed:  EKG:  EKG is not ordered today. The ekg ordered 02/02/2016   was personally reviewed by me and it revealed SR, PRWP.  Additional studies/ records that were reviewed personally reviewed by me today include:   Echo 02/03/2016: Left ventricle: The cavity size was normal. There was mild  concentric hypertrophy. Systolic function was mildly reduced. The  estimated ejection fraction was in the range of 45% to 50%.  Doppler parameters are consistent with abnormal left ventricular  relaxation (grade 1 diastolic dysfunction). - Left atrium: The atrium was mildly dilated. Impressions: - No cardiac source of emboli was indentified. However, this study  is very suboptimal.  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 BP in ER at the time of presentation was 212/113 Treatment per neuro - asa, statin.  Rec ldl goal < 70 ideally. Rec TEE as requested by neurology as part of cardiac work up Discussed indication, nature of procedure, risks and benefits with the patient. Risks include < 1% chance of major complications, including but not limited to esophageal perforation, GI bleed, infection, MI, CVA, death. Pt has no odynophgia, dysphagia, hx of previous radiation treatment to head/neck/chest, hx of GI bleed. Pt verbalized understanding and wishes to proceed. Screen with event monitor for arrhythmia, afib  HTN, uncontrolled. Presented with malignant HTN and CVA Need to evaluate for secondary causes: cushing's, pheochormocytoma, hyperaldosteronism. Rec appropiate blood wor kand  urine testing as noted below. In meantime, d/c propranolol. Start amlodipine, and metoprolol. BP monitor. BP log.  Abn echo with mildly reduced EF No evidence of CHF. No CP/SOB. Possibly related to uncontrolled HTN Will need BP control, ischemia eval w stress test down the line  OSA Rec CPAP compliance  Morbid Obesity Body mass index is 49.76 kg/(m^2).Marland Kitchen Recommend aggressive weight loss through diet and increased physical activity. Screen for cushing's.  Current medicines are reviewed at length with the patient today.  The patient does not have concerns regarding medicines.  Labs/ tests ordered today include:  Orders Placed This Encounter  Procedures  . Metanephrines, plasma  . Cortisol, urine, free  . Aldosterone + renin activity w/ ratio  . Cardiac event monitor      I had a lengthy and detailed discussion with the patient regarding diagnoses, prognosis, diagnostic options, treatment options, and side effects of medications.   I counseled the patient on importance of lifestyle modification including heart healthy diet, regular physical activity.  Disposition:   FU with undersigned after tests   High complexity decision making was necessary during this office visit. Pt is high risk with potential for complications due to significant medical issues  that are still being worked up. Pt verbalized understanding.    Signed, Wende Bushy, MD  02/09/2016 11:26 PM    [place]

## 2016-02-07 NOTE — Progress Notes (Signed)
     Rebecca Johnson was admitted to the [facility] on 02/02/2016 for an acute medical condition and was Discharged on  02/03/2016 . She had her vision affected and is still recovering from her acute condition. Has follow ups set for the next week. She cannot attend work until at least the next 2 weeks Should be able to return to work if cleared by Neurology and ophthalmology without any restrictions from 02/20/16.  Call Gladstone Lighter  MD, [facility] at  [phone] with questions.  Gladstone Lighter M.D on 02/07/2016,at 10:10 AM  [facility] [address], [place] [address]

## 2016-02-07 NOTE — Patient Instructions (Addendum)
Medication Instructions:  Your physician has recommended you make the following change in your medication:  1. Stop propranolol 2. Start Amlodipine 5 mg Once Daily 3. Metoprolol 25 mg Twice Daily   Labwork: See lab slips   Testing/Procedures: Your physician has recommended that you wear an event monitor. Event monitors are medical devices that record the heart's electrical activity. Doctors most often [place] these monitors to diagnose arrhythmias. Arrhythmias are problems with the speed or rhythm of the heartbeat. The monitor is a small, portable device. You can wear one while you do your normal daily activities. This is usually used to diagnose what is causing palpitations/syncope (passing out).    Your physician has requested that you have a TEE. During a TEE, sound waves are used to create images of your heart. It provides your doctor with information about the size and shape of your heart and how well your heart's chambers and valves are working. In this test, a transducer is attached to the end of a flexible tube that's guided down your throat and into your esophagus (the tube leading from you mouth to your stomach) to get a more detailed image of your heart. You are not awake for the procedure. Please see the instruction sheet given to you today. For further information please visit [email or web address].   WE WILL CALL TO SET THIS UP    Follow-Up: Your physician recommends that you schedule a follow-up appointment after testing to review results.  Date & Time: ___________________________________________________________________   Any Other Special Instructions Will Be Listed Below (If Applicable).     If you need a refill on your cardiac medications before your next appointment, please call your pharmacy.    Echocardiogram An echocardiogram, or echocardiography, uses sound waves (ultrasound) to produce an image of your heart. The echocardiogram is simple, painless, obtained  within a short period of time, and offers valuable information to your health care provider. The images from an echocardiogram can provide information such as:  Evidence of coronary artery disease (CAD).  Heart size.  Heart muscle function.  Heart valve function.  Aneurysm detection.  Evidence of a past heart attack.  Fluid buildup around the heart.  Heart muscle thickening.  Assess heart valve function. LET [facility] CARE PROVIDER KNOW ABOUT:  Any allergies you have.  All medicines you are taking, including vitamins, herbs, eye drops, creams, and over-the-counter medicines.  Previous problems you or members of your family have had with the use of anesthetics.  Any blood disorders you have.  Previous surgeries you have had.  Medical conditions you have.  Possibility of pregnancy, if this applies. BEFORE THE PROCEDURE  No special preparation is needed. Eat and drink normally.  PROCEDURE   In order to produce an image of your heart, gel will be applied to your chest and a wand-like tool (transducer) will be moved over your chest. The gel will help transmit the sound waves from the transducer. The sound waves will harmlessly bounce off your heart to allow the heart images to be captured in real-time motion. These images will then be recorded.  You may need an IV to receive a medicine that improves the quality of the pictures. AFTER THE PROCEDURE You may return to your normal schedule including diet, activities, and medicines, unless your health care provider tells you otherwise.   This information is not intended to replace advice given to you by your health care provider. Make sure you discuss any questions you have with your health  care provider.   Document Released: 11/02/2000 Document Revised: 11/26/2014 Document Reviewed: 07/13/2013 Elsevier Interactive Patient Education 2016 [place].     Transesophageal Echocardiogram Transesophageal echocardiography  (TEE) is a special type of test that produces images of the heart by using sound waves (echocardiogram). This type of echocardiography can obtain better images of the heart than standard echocardiography. TEE is done by passing a flexible tube down the esophagus. The heart is located in front of the esophagus. Because the heart and esophagus are close to one another, your health care provider can take very clear, detailed pictures of the heart via ultrasound waves. TEE may be done:  If your health care provider needs more information based on standard echocardiography findings.  If you had a stroke. This might have happened because a clot formed in your heart. TEE can visualize different areas of the heart and check for clots.  To check valve anatomy and function.  To check for infection on the inside of your heart (endocarditis).  To evaluate the dividing wall (septum) of the heart and presence of a hole that did not close after birth (patent foramen ovale or atrial septal defect).  To help diagnose a tear in the wall of the aorta (aortic dissection).  During cardiac valve surgery. This allows the surgeon to assess the valve repair before closing the chest.  During a variety of other cardiac procedures to guide positioning of catheters.  Sometimes before a cardioversion, which is a shock to convert heart rhythm back to normal. LET [facility] CARE PROVIDER KNOW ABOUT:   Any allergies you have.  All medicines you are taking, including vitamins, herbs, eye drops, creams, and over-the-counter medicines.  Previous problems you or members of your family have had with the use of anesthetics.  Any blood disorders you have.  Previous surgeries you have had.  Medical conditions you have.  Swallowing difficulties.  An esophageal obstruction. RISKS AND COMPLICATIONS  Generally, TEE is a safe procedure. However, as with any procedure, complications can occur. Possible complications include  an esophageal tear (rupture). BEFORE THE PROCEDURE   Do not eat or drink for 6 hours before the procedure or as directed by your health care provider.  Arrange for someone to drive you home after the procedure. Do not drive yourself home. During the procedure, you will be given medicines that can continue to make you feel drowsy and can impair your reflexes.  An IV access tube will be started in the arm. PROCEDURE   A medicine to help you relax (sedative) will be given through the IV access tube.  A medicine may be sprayed or gargled to numb the back of the throat.  Your blood pressure, heart rate, and breathing (vital signs) will be monitored during the procedure.  The TEE probe is a long, flexible tube. The tip of the probe is placed into the back of the mouth, and you will be asked to swallow. This helps to pass the tip of the probe into the esophagus. Once the tip of the probe is in the correct area, your health care provider can take pictures of the heart.  TEE is usually not a painful procedure. You may feel the probe press against the back of the throat. The probe does not enter the trachea and does not affect your breathing. AFTER THE PROCEDURE   You will be in bed, resting, until you have fully returned to consciousness.  When you first awaken, your throat may feel slightly  sore and will probably still feel numb. This will improve slowly over time.  You will not be allowed to eat or drink until it is clear that the numbness has improved.  Once you have been able to drink, urinate, and sit on the edge of the bed without feeling sick to your stomach (nausea) or dizzy, you may be cleared to go home.  You should have a friend or family member with you for the next 24 hours after your procedure.   This information is not intended to replace advice given to you by your health care provider. Make sure you discuss any questions you have with your health care provider.   Document  Released: 01/26/2003 Document Revised: 11/10/2013 Document Reviewed: 05/07/2013 Elsevier Interactive Patient Education 2016 [employer].    Cardiac Event Monitoring A cardiac event monitor is a small recording device used to help detect abnormal heart rhythms (arrhythmias). The monitor is used to record heart rhythm when noticeable symptoms such as the following occur:  Fast heartbeats (palpitations), such as heart racing or fluttering.  Dizziness.  Fainting or light-headedness.  Unexplained weakness. The monitor is wired to two electrodes placed on your chest. Electrodes are flat, sticky disks that attach to your skin. The monitor can be worn for up to 30 days. You will wear the monitor at all times, except when bathing.  HOW TO USE YOUR CARDIAC EVENT MONITOR A technician will prepare your chest for the electrode placement. The technician will show you how to place the electrodes, how to work the monitor, and how to replace the batteries. Take time to practice using the monitor before you leave the office. Make sure you understand how to send the information from the monitor to your health care provider. This requires a telephone with a landline, not a cell phone. You need to:  Wear your monitor at all times, except when you are in water:  Do not get the monitor wet.  Take the monitor off when bathing. Do not swim or use a hot tub with it on.  Keep your skin clean. Do not put body lotion or moisturizer on your chest.  Change the electrodes daily or any time they stop sticking to your skin. You might need to use tape to keep them on.  It is possible that your skin under the electrodes could become irritated. To keep this from happening, try to put the electrodes in slightly different places on your chest. However, they must remain in the area under your left breast and in the upper right section of your chest.  Make sure the monitor is safely clipped to your clothing or in a location  close to your body that your health care provider recommends.  Press the button to record when you feel symptoms of heart trouble, such as dizziness, weakness, light-headedness, palpitations, thumping, shortness of breath, unexplained weakness, or a fluttering or racing heart. The monitor is always on and records what happened slightly before you pressed the button, so do not worry about being too late to get good information.  Keep a diary of your activities, such as walking, doing chores, and taking medicine. It is especially important to note what you were doing when you pushed the button to record your symptoms. This will help your health care provider determine what might be contributing to your symptoms. The information stored in your monitor will be reviewed by your health care provider alongside your diary entries.  Send the recorded information as  recommended by your health care provider. It is important to understand that it will take some time for your health care provider to process the results.  Change the batteries as recommended by your health care provider. SEEK IMMEDIATE MEDICAL CARE IF:   You have chest pain.  You have extreme difficulty breathing or shortness of breath.  You develop a very fast heartbeat that persists.  You develop dizziness that does not go away.  You faint or constantly feel you are about to faint.   This information is not intended to replace advice given to you by your health care provider. Make sure you discuss any questions you have with your health care provider.   Document Released: 08/14/2008 Document Revised: 11/26/2014 Document Reviewed: 05/04/2013 Elsevier Interactive Patient Education [employer].

## 2016-02-08 ENCOUNTER — Telehealth: Payer: Self-pay | Admitting: Cardiology

## 2016-02-08 ENCOUNTER — Other Ambulatory Visit: Payer: Self-pay | Admitting: Cardiology

## 2016-02-08 NOTE — Telephone Encounter (Signed)
Patient scheduled for TEE on Monday 02/13/16 at 11:45 AM. Patient to be NPO past midnight the night prior and to take her morning medications with a small sip of water. She verbalized understanding of all instructions, location, time, and she had no further questions at this time. Let patient know to call if she should have any questions.

## 2016-02-09 ENCOUNTER — Encounter: Payer: Self-pay | Admitting: Cardiology

## 2016-02-09 DIAGNOSIS — I639 Cerebral infarction, unspecified: Secondary | ICD-10-CM | POA: Insufficient documentation

## 2016-02-09 DIAGNOSIS — G4733 Obstructive sleep apnea (adult) (pediatric): Secondary | ICD-10-CM | POA: Insufficient documentation

## 2016-02-09 DIAGNOSIS — R931 Abnormal findings on diagnostic imaging of heart and coronary circulation: Secondary | ICD-10-CM | POA: Insufficient documentation

## 2016-02-09 DIAGNOSIS — I10 Essential (primary) hypertension: Secondary | ICD-10-CM | POA: Insufficient documentation

## 2016-02-09 LAB — PROTEIN S, TOTAL: PROTEIN S AG TOTAL: 96 % (ref 60–150)

## 2016-02-10 ENCOUNTER — Telehealth: Payer: Self-pay | Admitting: Cardiology

## 2016-02-10 LAB — PROTEIN S, TOTAL: Protein S Ag, Total: 96 % (ref 60–150)

## 2016-02-10 NOTE — Telephone Encounter (Signed)
Pt has TEE on Monday 02/13/16 She has some questions about preparing for it.  1. What time does she need to arrive for it 2. She was told not to eat before procedure, but she takes a 65 Asprin with food, wants to know if she should wait to take this Please advise

## 2016-02-10 NOTE — Telephone Encounter (Signed)
S/w pt and reviewed TEE instructions including time, NPO status, and medications. Pt verbalized understanding with no further questions.

## 2016-02-13 ENCOUNTER — Ambulatory Visit (HOSPITAL_BASED_OUTPATIENT_CLINIC_OR_DEPARTMENT_OTHER)
Admission: RE | Admit: 2016-02-13 | Discharge: 2016-02-13 | Disposition: A | Discharge status: Home / Self Care | Payer: 59 | Source: Ambulatory Visit | Attending: Cardiology | Admitting: Cardiology

## 2016-02-13 ENCOUNTER — Encounter: Payer: Self-pay | Admitting: *Deleted

## 2016-02-13 ENCOUNTER — Ambulatory Visit: Payer: 59

## 2016-02-13 ENCOUNTER — Encounter: Payer: Self-pay | Admitting: Cardiology

## 2016-02-13 ENCOUNTER — Encounter
Admission: RE | Disposition: A | Discharge status: Home / Self Care | Payer: Self-pay | Source: Ambulatory Visit | Attending: Cardiovascular Disease

## 2016-02-13 ENCOUNTER — Ambulatory Visit
Admission: RE | Admit: 2016-02-13 | Discharge: 2016-02-13 | Disposition: A | Discharge status: Home / Self Care | Payer: 59 | Source: Ambulatory Visit | Attending: Cardiovascular Disease | Admitting: Cardiovascular Disease

## 2016-02-13 ENCOUNTER — Telehealth: Payer: Self-pay | Admitting: Cardiology

## 2016-02-13 DIAGNOSIS — I10 Essential (primary) hypertension: Secondary | ICD-10-CM | POA: Insufficient documentation

## 2016-02-13 DIAGNOSIS — I69398 Other sequelae of cerebral infarction: Secondary | ICD-10-CM | POA: Insufficient documentation

## 2016-02-13 DIAGNOSIS — I340 Nonrheumatic mitral (valve) insufficiency: Secondary | ICD-10-CM | POA: Insufficient documentation

## 2016-02-13 DIAGNOSIS — H538 Other visual disturbances: Secondary | ICD-10-CM | POA: Diagnosis not present

## 2016-02-13 DIAGNOSIS — Z803 Family history of malignant neoplasm of breast: Secondary | ICD-10-CM | POA: Insufficient documentation

## 2016-02-13 DIAGNOSIS — G4730 Sleep apnea, unspecified: Secondary | ICD-10-CM | POA: Diagnosis not present

## 2016-02-13 DIAGNOSIS — Z7982 Long term (current) use of aspirin: Secondary | ICD-10-CM | POA: Insufficient documentation

## 2016-02-13 DIAGNOSIS — I639 Cerebral infarction, unspecified: Secondary | ICD-10-CM

## 2016-02-13 DIAGNOSIS — Z79899 Other long term (current) drug therapy: Secondary | ICD-10-CM | POA: Diagnosis not present

## 2016-02-13 DIAGNOSIS — Z8249 Family history of ischemic heart disease and other diseases of the circulatory system: Secondary | ICD-10-CM | POA: Insufficient documentation

## 2016-02-13 DIAGNOSIS — Z8673 Personal history of transient ischemic attack (TIA), and cerebral infarction without residual deficits: Secondary | ICD-10-CM | POA: Diagnosis present

## 2016-02-13 DIAGNOSIS — Z6841 Body Mass Index (BMI) 40.0 and over, adult: Secondary | ICD-10-CM | POA: Insufficient documentation

## 2016-02-13 HISTORY — PX: TEE WITHOUT CARDIOVERSION: SHX5443

## 2016-02-13 HISTORY — DX: Cerebral infarction, unspecified: I63.9

## 2016-02-13 LAB — CBC
HEMATOCRIT: 39.5 % (ref 35.0–47.0)
Hemoglobin: 13.4 g/dL (ref 12.0–16.0)
MCH: 29.9 pg (ref 26.0–34.0)
MCHC: 33.9 g/dL (ref 32.0–36.0)
MCV: 88.2 fL (ref 80.0–100.0)
PLATELETS: 306 10*3/uL (ref 150–440)
RBC: 4.48 MIL/uL (ref 3.80–5.20)
RDW: 14.3 % (ref 11.5–14.5)
WBC: 10.3 10*3/uL (ref 3.6–11.0)

## 2016-02-13 LAB — PROTIME-INR
INR: 1.06
Prothrombin Time: 14 seconds (ref 11.4–15.0)

## 2016-02-13 SURGERY — ECHOCARDIOGRAM, TRANSESOPHAGEAL
Anesthesia: Moderate Sedation

## 2016-02-13 MED ORDER — MIDAZOLAM HCL 5 MG/5ML IJ SOLN
INTRAMUSCULAR | Status: DC | PRN
  Administered 2016-02-13: 1 mg via INTRAVENOUS
  Administered 2016-02-13: 2 mg via INTRAVENOUS
  Administered 2016-02-13: 1 mg via INTRAVENOUS

## 2016-02-13 MED ORDER — FENTANYL CITRATE (PF) 100 MCG/2ML IJ SOLN
INTRAMUSCULAR | Status: AC
  Filled 2016-02-13: qty 2

## 2016-02-13 MED ORDER — MIDAZOLAM HCL 5 MG/5ML IJ SOLN
INTRAMUSCULAR | Status: AC
  Filled 2016-02-13: qty 5

## 2016-02-13 MED ORDER — LIDOCAINE VISCOUS 2 % MT SOLN
OROMUCOSAL | Status: AC
  Filled 2016-02-13: qty 15

## 2016-02-13 MED ORDER — FENTANYL CITRATE (PF) 100 MCG/2ML IJ SOLN
INTRAMUSCULAR | Status: DC | PRN
  Administered 2016-02-13 (×2): 12.5 ug via INTRAVENOUS

## 2016-02-13 MED ORDER — SODIUM CHLORIDE 0.9 % IV SOLN: INTRAVENOUS | Status: DC

## 2016-02-13 NOTE — Discharge Instructions (Signed)

## 2016-02-13 NOTE — Telephone Encounter (Signed)
Pt wants to know is it okay for her to starts an exercise regimen  Please advise

## 2016-02-13 NOTE — Telephone Encounter (Signed)
Patient had TEE earlier today and she states that she is feeling good. She wanted to know about starting a exercise regimen and let her know per Dr. Tora Kindred last visit note that it would be great if she could start a regular exercise program and increase activity slowly. She verbalized understanding and had no further questions.

## 2016-02-14 ENCOUNTER — Encounter: Payer: Self-pay | Admitting: Cardiology

## 2016-02-14 LAB — CARDIOLIPIN ANTIBODIES, IGG, IGM, IGA
Anticardiolipin IgA: <9 APL U/mL (ref 0–11)
Anticardiolipin IgM: <9 MPL U/mL (ref 0–12)

## 2016-02-14 LAB — FACTOR 5 LEIDEN

## 2016-02-14 LAB — ANA W/REFLEX IF POSITIVE: Anti Nuclear Antibody(ANA): NEGATIVE

## 2016-02-14 LAB — LUPUS ANTICOAGULANT PANEL
DRVVT: 29.6 s (ref 0.0–44.0)
PTT Lupus Anticoagulant: 29.6 s (ref 0.0–43.6)

## 2016-02-14 LAB — PROTEIN C, TOTAL: Protein C, Total: 76 % (ref 60–150)

## 2016-02-14 LAB — HOMOCYSTEINE: Homocysteine: 9.3 umol/L (ref 0.0–15.0)

## 2016-02-15 ENCOUNTER — Ambulatory Visit (INDEPENDENT_AMBULATORY_CARE_PROVIDER_SITE_OTHER): Payer: 59

## 2016-02-15 DIAGNOSIS — R931 Abnormal findings on diagnostic imaging of heart and coronary circulation: Secondary | ICD-10-CM

## 2016-02-16 LAB — PROTEIN S, TOTAL: PROTEIN S AG TOTAL: 96 % (ref 60–150)

## 2016-02-17 ENCOUNTER — Encounter: Payer: Self-pay | Admitting: Cardiology

## 2016-03-27 ENCOUNTER — Ambulatory Visit (INDEPENDENT_AMBULATORY_CARE_PROVIDER_SITE_OTHER): Payer: 59 | Admitting: Cardiology

## 2016-03-27 ENCOUNTER — Encounter: Payer: Self-pay | Admitting: Cardiology

## 2016-03-27 VITALS — BP 114/80 | HR 65 | Ht 62.0 in | Wt 256.4 lb

## 2016-03-27 DIAGNOSIS — Q211 Atrial septal defect: Secondary | ICD-10-CM

## 2016-03-27 DIAGNOSIS — I10 Essential (primary) hypertension: Secondary | ICD-10-CM

## 2016-03-27 DIAGNOSIS — G4733 Obstructive sleep apnea (adult) (pediatric): Secondary | ICD-10-CM | POA: Diagnosis not present

## 2016-03-27 DIAGNOSIS — R931 Abnormal findings on diagnostic imaging of heart and coronary circulation: Secondary | ICD-10-CM

## 2016-03-27 DIAGNOSIS — Q2112 Patent foramen ovale: Secondary | ICD-10-CM

## 2016-03-27 MED ORDER — AMLODIPINE BESYLATE 5 MG PO TABS
5.0000 mg | ORAL_TABLET | Freq: Every day | ORAL | Status: AC
Start: 2016-03-27 — End: ?

## 2016-03-27 MED ORDER — METOPROLOL TARTRATE 25 MG PO TABS: 25.0000 mg | ORAL_TABLET | Freq: Two times a day (BID) | ORAL | Status: AC

## 2016-03-27 NOTE — Patient Instructions (Addendum)
Medication Instructions:  Your physician recommends that you continue on your current medications as directed. Please refer to the Current Medication list given to you today.  Refills sent in to [facility] for Metoprolol and Amlodipine.  Labwork: None ordered  Testing/Procedures: Your physician has requested that you have an echocardiogram. Echocardiography is a painless test that uses sound waves to create images of your heart. It provides your doctor with information about the size and shape of your heart and how well your heart's chambers and valves are working. This procedure takes approximately one hour. There are no restrictions for this procedure. Echo to be done in 7 months per Dr. Yvone Neu.   Date & Time: _________________________________________________________________________  Follow-Up: Your physician recommends that you schedule a follow-up appointment after echo has been done in 7 months.  Date & Time: _______May send you a letter reminding you to schedule follow up. Make sure that it is after your echo has been scheduled and completed. ___________  Please do not hesitate to give [place] a call if you have any further questions. [phone]    Any Other Special Instructions Will Be Listed Below (If Applicable).     If you need a refill on your cardiac medications before your next appointment, please call your pharmacy.

## 2016-03-27 NOTE — Progress Notes (Signed)
Cardiology Office Note   Date:  03/27/2016   ID:  Rebecca Johnson, DOB 02-Jul-1971, MRN [id number]  Referring Doctor:  Kirk Ruths., MD   Cardiologist:   Wende Bushy, MD   Reason for consultation:  Chief Complaint  Patient presents with  . Cerebrovascular Accident    NO CP, SOB OR SWELLING. NO OTHER COMPLAINTS.      History of Present Illness: Rebecca Johnson is a 45 y.o. female who presents for ffup after TEE/test results  No issues post TEE: no dysphagia, odynophagia, sore throat. No complications.  In terms of hypertension, she has noted significant improvement in her readings. 0000000 0000000 diastolic per her blood pressure log. Overall, she feels a difference with better blood pressure control. She feels generally well. No shortness of breath, no chest pains.  In terms of sleep apnea, she's using the CPAP every night.  No headache, fever, cough, colds, abdominal pain. No orthopnea, edema, PND. No palpitations, loss of consciousness.   ROS:  Please see the history of present illness. Aside from mentioned under HPI, all other systems are reviewed and negative.    Past Medical History  Diagnosis Date  . Hypertension   . Sleep apnea   . Stroke [name])     Past Surgical History  Procedure Laterality Date  . Wrist surgery Right 2015  . Tee without cardioversion N/A 02/13/2016    Procedure: TRANSESOPHAGEAL ECHOCARDIOGRAM (TEE);  Surgeon: Wende Bushy, MD;  Location: [facility] ORS;  Service: Cardiovascular;  Laterality: N/A;     reports that she has never smoked. She does not have any smokeless tobacco history on file. She reports that she does not drink alcohol or use illicit drugs.   family history includes Atrial fibrillation in her maternal grandmother; Breast cancer (age of onset: 52) in her maternal grandmother.   Current Outpatient Prescriptions  Medication Sig Dispense Refill  . amLODipine (NORVASC) 5 MG tablet Take 1 tablet (5 mg total) by  mouth daily. 30 tablet 6  . aspirin 325 MG tablet Take 1 tablet (325 mg total) by mouth daily. 30 tablet 2  . atorvastatin (LIPITOR) 40 MG tablet Take 1 tablet (40 mg total) by mouth daily at 6 PM. 30 tablet 2  . metoprolol tartrate (LOPRESSOR) 25 MG tablet Take 1 tablet (25 mg total) by mouth 2 (two) times daily. 60 tablet 6   No current facility-administered medications for this visit.    Allergies: Review of patient's allergies indicates no known allergies.    PHYSICAL EXAM: VS:  BP 114/80 mmHg  Pulse 65  Ht 5\' 2"  (1.575 m)  Wt 256 lb 6.4 oz (116.302 kg)  BMI 46.88 kg/m2 , Body mass index is 46.88 kg/(m^2). Wt Readings from Last 3 Encounters:  03/27/16 256 lb 6.4 oz (116.302 kg)  02/13/16 270 lb (122.471 kg)  02/07/16 272 lb 1.9 oz (123.433 kg)    GENERAL:  well developed, well nourished, morbidly obese, not in acute distress HEENT: normocephalic, pink conjunctivae, anicteric sclerae, no xanthelasma, normal dentition, oropharynx clear NECK:  no neck vein engorgement, JVP normal, no hepatojugular reflux, carotid upstroke brisk and symmetric, no bruit, no thyromegaly, no lymphadenopathy LUNGS:  good respiratory effort, clear to auscultation bilaterally CV:  PMI not displaced, no thrills, no lifts, S1 and S2 within normal limits, no palpable S3 or S4, no murmurs, no rubs, no gallops ABD:  Soft, nontender, nondistended, normoactive bowel sounds, no abdominal aortic bruit, no hepatomegaly, no splenomegaly, striae noted/appearance of stretch marks MS:  nontender back, no kyphosis, no scoliosis, no joint deformities EXT:  2+ DP/PT pulses, no edema, no varicosities, no cyanosis, no clubbing SKIN: warm, nondiaphoretic, normal turgor, no ulcers NEUROPSYCH: alert, oriented to person, place, and time, sensory/motor grossly intact, normal mood, appropriate affect  Recent Labs: 02/02/2016: ALT 12*; BUN 10; Creatinine, Ser 0.60; Potassium 3.9; Sodium 136 02/03/2016: TSH 1.636 02/13/2016:  Hemoglobin 13.4; Platelets 306   Lipid Panel    Component Value Date/Time   CHOL 157 02/03/2016 0415   TRIG 66 02/03/2016 0415   HDL 44 02/03/2016 0415   CHOLHDL 3.6 02/03/2016 0415   VLDL 13 02/03/2016 0415   LDLCALC 100* 02/03/2016 0415     Other studies Reviewed:  EKG:  The ekg ordered 02/02/2016   was personally reviewed by me and it revealed SR, PRWP. EKG from 03/27/2016 was personally reviewed by me and it showed sinus rhythm, 65 BPM.  Additional studies/ records that were reviewed personally reviewed by me today include:   Echo 02/03/2016: Left ventricle: The cavity size was normal. There was mild  concentric hypertrophy. Systolic function was mildly reduced. The  estimated ejection fraction was in the range of 45% to 50%.  Doppler parameters are consistent with abnormal left ventricular  relaxation (grade 1 diastolic dysfunction). - Left atrium: The atrium was mildly dilated. Impressions: - No cardiac source of emboli was indentified. However, this study  is very suboptimal.  Carotid US  02/03/2016: negative  TEE 02/13/2016: Study Conclusions - Left atrium: No evidence of thrombus in the atrial cavity or  appendage. - Atrial septum: There was a possible patent foramen ovale.  Agitated saline contrast study showed a right-to-left shunt.  There is minimal right ot left shunt. - Impressions: No cardiac source of emboli was indentified. LV systolic function normal  Cardiac monitor 03/16/2016: Monitoring period was 02/15/2016 to 03/15/2016. Baseline sample showed sinus rhythm, 72 BPM. Automatically detected events: Sinus rhythm Sinus bradycardia with sinus arrhythmia, heart rate of 45 bpm at 3:27 AM. 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 BP in ER at the time of presentation was 212/113 Treatment per neuro - asa, statin.    Rec ldl goal < 70 ideally. TEE --- 02/13/2016 showed no evidence of thrombus in L a or L AAA. Based on a bubble study, possible PFO. Appendectomy systolic function appeared normal on the TEE. Event monitor 03/16/2016 no afib Results were discussed in detail with the patient.  Possible PFO Explained to patient what this is and how it relates to her history of stroke. Most likely, the stroke was related to malignant hypertension. Patient has been on aspirin 325 mg by mouth daily since the TEE. Continue medications for now.  HTN, improved.  Presented with malignant HTN and CVA Need to evaluate for secondary causes: cushing's, pheochormocytoma, hyperaldosteronism -- results of testing are unremarkable. PA-C/PRA is within normal limits. Metanephrines within normal limits. Urinary cortisol 3/27 wnl Discussed findings with patient. Patient reassured. Continue current medications amlodipine 5 minutes. Daily, metoprolol 25 g twice a day. Continue lifestyle changes. Continue with weight loss efforts. metanephrines  Abn echo with mildly reduced EF No evidence of CHF. No CP/SOB. Possibly related to uncontrolled HTN LV systolic function appeared normal on TEE. Recommend repeat limited echo in 9 months.  OSA Rec CPAP compliance  Morbid Obesity Body mass index is 46.88 kg/(m^2).Marland Kitchen Recommend aggressive weight loss through diet and increased  physical activity.  Commended on weight loss.  Current medicines are reviewed at length with the patient today.  The patient does not have concerns regarding medicines.  Labs/ tests ordered today include:  No orders of the defined types were placed in this encounter.      I had a lengthy and detailed discussion with the patient regarding diagnoses, prognosis, diagnostic options, treatment options, and side effects of medications.   I counseled the patient on importance of lifestyle modification including heart healthy diet, regular physical  activity.  Disposition:   FU with undersigned in 9 months   Signed, Wende Bushy, MD  03/27/2016 9:28 AM    [place]

## 2016-09-24 ENCOUNTER — Other Ambulatory Visit: Payer: Self-pay | Admitting: Obstetrics and Gynecology

## 2016-09-24 DIAGNOSIS — Z1231 Encounter for screening mammogram for malignant neoplasm of breast: Secondary | ICD-10-CM

## 2016-10-22 ENCOUNTER — Ambulatory Visit (INDEPENDENT_AMBULATORY_CARE_PROVIDER_SITE_OTHER): Payer: 59

## 2016-10-22 ENCOUNTER — Other Ambulatory Visit: Payer: Self-pay

## 2016-10-22 DIAGNOSIS — R931 Abnormal findings on diagnostic imaging of heart and coronary circulation: Secondary | ICD-10-CM

## 2016-10-22 LAB — ECHOCARDIOGRAM LIMITED
FS: 35 % (ref 28–44)
IVS/LV PW RATIO, ED: 0.69
LA ID, A-P, ES: 39 mm
LA vol A4C: 62.6 ml
LADIAMINDEX: 1.68 cm/m2
LAVOL: 73.3 mL
LAVOLIN: 31.5 mL/m2
LEFT ATRIUM END SYS DIAM: 39 mm
LV PW d: 9.67 mm — AB (ref 0.6–1.1)
LV dias vol index: 44 mL/m2
LV dias vol: 102 mL (ref 46–106)
LV sys vol: 35 mL (ref 14–42)
LVSYSVOLIN: 15 mL/m2
Simpson's disk: 66
Stroke v: 67 ml

## 2016-10-25 ENCOUNTER — Ambulatory Visit: Payer: 59 | Admitting: Cardiology

## 2016-10-29 ENCOUNTER — Ambulatory Visit
Admission: RE | Admit: 2016-10-29 | Discharge: 2016-10-29 | Disposition: A | Discharge status: Home / Self Care | Payer: 59 | Source: Ambulatory Visit | Attending: Obstetrics and Gynecology | Admitting: Obstetrics and Gynecology

## 2016-10-29 DIAGNOSIS — Z1231 Encounter for screening mammogram for malignant neoplasm of breast: Secondary | ICD-10-CM | POA: Insufficient documentation

## 2016-10-30 ENCOUNTER — Encounter: Payer: Self-pay | Admitting: *Deleted

## 2016-11-16 ENCOUNTER — Ambulatory Visit (INDEPENDENT_AMBULATORY_CARE_PROVIDER_SITE_OTHER): Payer: 59 | Admitting: Cardiology

## 2016-11-16 ENCOUNTER — Encounter: Payer: Self-pay | Admitting: Cardiology

## 2016-11-16 VITALS — BP 128/90 | HR 78 | Ht 62.0 in | Wt 247.5 lb

## 2016-11-16 DIAGNOSIS — Q211 Atrial septal defect: Secondary | ICD-10-CM

## 2016-11-16 DIAGNOSIS — I10 Essential (primary) hypertension: Secondary | ICD-10-CM

## 2016-11-16 DIAGNOSIS — I639 Cerebral infarction, unspecified: Secondary | ICD-10-CM

## 2016-11-16 DIAGNOSIS — Q2112 Patent foramen ovale: Secondary | ICD-10-CM

## 2016-11-16 NOTE — Patient Instructions (Signed)
Follow-Up: Your physician wants you to follow-up in: 1 year with Dr. Yvone Neu. You will receive a reminder letter in the mail two months in advance. If you don't receive a letter, please call our office to schedule the follow-up appointment.  It was a pleasure seeing you today here in the office. Please do not hesitate to give [place] a call back if you have any further questions. [place], BSN

## 2016-11-16 NOTE — Progress Notes (Signed)
Cardiology Office Note   Date:  11/16/2016   ID:  Rebecca Johnson, DOB 28-Mar-1971, MRN [id number]  Referring Doctor:  Kirk Ruths., MD   Cardiologist:   Wende Bushy, MD   Reason for consultation:  Chief Complaint  Patient presents with  . other    8 month f/u. Pt states she has been doing well. Reviewed meds with pt verbally.      History of Present Illness: Rebecca Johnson is a 45 y.o. female who presents for ffup for HTN, PFO  In terms of hypertension, she has noted to have blood pressure within normal limits. No chest pain. No shortness of breath.   In terms of sleep apnea, she's using the CPAP every night.  No headache, fever, cough, colds, abdominal pain. No orthopnea, edema, PND. No palpitations, loss of consciousness. She has lost approximately 10 pounds since last visit.   ROS:  Please see the history of present illness. Aside from mentioned under HPI, all other systems are reviewed and negative.    Past Medical History:  Diagnosis Date  . Hypertension   . Sleep apnea   . Stroke [facility])     Past Surgical History:  Procedure Laterality Date  . TEE WITHOUT CARDIOVERSION N/A 02/13/2016   Procedure: TRANSESOPHAGEAL ECHOCARDIOGRAM (TEE);  Surgeon: Wende Bushy, MD;  Location: [facility] ORS;  Service: Cardiovascular;  Laterality: N/A;  . WRIST SURGERY Right 2015     reports that she has never smoked. She has never used smokeless tobacco. She reports that she does not drink alcohol or use drugs.   family history includes Atrial fibrillation in her maternal grandmother; Breast cancer (age of onset: 67) in her maternal grandmother; Diabetes in her father and mother; Hypertension in her father and mother.   Current Outpatient Prescriptions  Medication Sig Dispense Refill  . amLODipine (NORVASC) 5 MG tablet Take 1 tablet (5 mg total) by mouth daily. 90 tablet 6  . aspirin 325 MG tablet Take 1 tablet (325 mg total) by mouth daily. 30 tablet 2  .  atorvastatin (LIPITOR) 40 MG tablet Take 1 tablet (40 mg total) by mouth daily at 6 PM. 30 tablet 2  . metoprolol tartrate (LOPRESSOR) 25 MG tablet Take 1 tablet (25 mg total) by mouth 2 (two) times daily. 180 tablet 6   No current facility-administered medications for this visit.     Allergies: Patient has no known allergies.    PHYSICAL EXAM: VS:  BP 128/90 (BP Location: Left Arm, Patient Position: Sitting, Cuff Size: Large)   Pulse 78   Ht 5\' 2"  (1.575 m)   Wt 247 lb 8 oz (112.3 kg)   LMP 10/10/2016   BMI 45.27 kg/m  , Body mass index is 45.27 kg/m. Wt Readings from Last 3 Encounters:  11/16/16 247 lb 8 oz (112.3 kg)  03/27/16 256 lb 6.4 oz (116.3 kg)  02/13/16 270 lb (122.5 kg)    GENERAL:  well developed, well nourished, morbidly obese, not in acute distress HEENT: normocephalic, pink conjunctivae, anicteric sclerae, no xanthelasma, normal dentition, oropharynx clear NECK:  no neck vein engorgement, JVP normal, no hepatojugular reflux, carotid upstroke brisk and symmetric, no bruit, no thyromegaly, no lymphadenopathy LUNGS:  good respiratory effort, clear to auscultation bilaterally CV:  PMI not displaced, no thrills, no lifts, S1 and S2 within normal limits, no palpable S3 or S4, no murmurs, no rubs, no gallops ABD:  Soft, nontender, nondistended, normoactive bowel sounds, no abdominal aortic bruit, no hepatomegaly, no splenomegaly,  striae noted/appearance of stretch marks MS: nontender back, no kyphosis, no scoliosis, no joint deformities EXT:  2+ DP/PT pulses, no edema, no varicosities, no cyanosis, no clubbing SKIN: warm, nondiaphoretic, normal turgor, no ulcers NEUROPSYCH: alert, oriented to person, place, and time, sensory/motor grossly intact, normal mood, appropriate affect  Recent Labs: 02/02/2016: ALT 12; BUN 10; Creatinine, Ser 0.60; Potassium 3.9; Sodium 136 02/03/2016: TSH 1.636 02/13/2016: Hemoglobin 13.4; Platelets 306   Lipid Panel    Component Value  Date/Time   CHOL 157 02/03/2016 0415   TRIG 66 02/03/2016 0415   HDL 44 02/03/2016 0415   CHOLHDL 3.6 02/03/2016 0415   VLDL 13 02/03/2016 0415   LDLCALC 100 (H) 02/03/2016 0415     Other studies Reviewed:  EKG:  The ekg ordered 02/02/2016   was personally reviewed by me and it revealed SR, PRWP. EKG from 03/27/2016 was personally reviewed by me and it showed sinus rhythm, 65 BPM.  Additional studies/ records that were reviewed personally reviewed by me today include:   Echo 02/03/2016: Left ventricle: The cavity size was normal. There was mild  concentric hypertrophy. Systolic function was mildly reduced. The  estimated ejection fraction was in the range of 45% to 50%.  Doppler parameters are consistent with abnormal left ventricular  relaxation (grade 1 diastolic dysfunction). - Left atrium: The atrium was mildly dilated. Impressions: - No cardiac source of emboli was indentified. However, this study  is very suboptimal.  Carotid US  02/03/2016: negative  TEE 02/13/2016: Study Conclusions - Left atrium: No evidence of thrombus in the atrial cavity or  appendage. - Atrial septum: There was a possible patent foramen ovale.  Agitated saline contrast study showed a right-to-left shunt.  There is minimal right ot left shunt. - Impressions: No cardiac source of emboli was indentified. LV systolic function normal  Cardiac monitor 03/16/2016: Monitoring period was 02/15/2016 to 03/15/2016. Baseline sample showed sinus rhythm, 72 BPM. Automatically detected events: Sinus rhythm Sinus bradycardia with sinus arrhythmia, heart rate of 45 bpm at 3:27 AM. No evidence of atrial fibrillation  Echo 10/22/2016: Left ventricle: The cavity size was normal. Systolic function was   normal. The estimated ejection fraction was in the range of 50%   to 55%. Wall motion was normal; there were no regional wall   motion abnormalities. The study is not technically sufficient to   allow  evaluation of LV diastolic function. - Mitral valve: There was mild regurgitation. - Left atrium: The atrium was normal in size. - Right ventricle: Systolic function was normal. - Atrial septum: A patent foramen ovale cannot be excluded. - Pulmonary arteries: Systolic pressure was within the normal   range.  Impressions:  - No cardiac source of emboli was indentified.  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 BP in ER at the time of presentation was 212/113 Treatment per neuro - asa, statin.  Rec ldl goal < 70 ideally. TEE --- 02/13/2016 showed no evidence of thrombus in L a or L AAA. Based on a bubble study, possible PFO. Appendage systolic function appeared normal on the TEE. Event monitor 03/16/2016 no afib Results were discussed in detail with the patient.  Possible PFO Explained to patient what this is and how it relates to her history of stroke. Most likely, the stroke was related to malignant hypertension. Patient has been on aspirin 325 mg by mouth daily since the TEE. Continue medications for now.  HTN, improved.  Presented with malignant HTN and CVA Need to evaluate for secondary causes: cushing's, pheochormocytoma, hyperaldosteronism -- results of testing are unremarkable. PA-C/PRA is within normal limits. Metanephrines within normal limits. Urinary cortisol 3/27 wnl Discussed findings with patient. Patient reassured. Continue current medications amlodipine 5 minutes. Daily, metoprolol 25 g twice a day. Continue lifestyle changes. Continue with weight loss efforts. Intended to monitor blood pressure. If significant weight loss ensues with further reduction in her blood pressure, she may need adjustment of her medications.  Abn echo with mildly reduced EF No evidence of CHF. No CP/SOB. Possibly related to uncontrolled HTN LV systolic function appeared normal on TEE. LVEF noted  to improve on more recent echocardiogram 10/22/2016.  OSA Rec CPAP compliance  Morbid Obesity Body mass index is 45.27 kg/m.Marland Kitchen Recommend aggressive weight loss through diet and increased physical activity.  Commended on weight loss.  Current medicines are reviewed at length with the patient today.  The patient does not have concerns regarding medicines.  Labs/ tests ordered today include:  Orders Placed This Encounter  Procedures  . EKG 12-Lead      I had a lengthy and detailed discussion with the patient regarding diagnoses, prognosis, diagnostic options, treatment options, and side effects of medications.   I counseled the patient on importance of lifestyle modification including heart healthy diet, regular physical activity.  Disposition:   FU with undersigned in One year   Signed, Wende Bushy, MD  11/16/2016 3:00 PM    [place]

## 2017-09-30 ENCOUNTER — Other Ambulatory Visit: Payer: Self-pay | Admitting: Obstetrics and Gynecology

## 2017-09-30 DIAGNOSIS — Z1231 Encounter for screening mammogram for malignant neoplasm of breast: Secondary | ICD-10-CM

## 2017-10-06 IMAGING — MG MM DIGITAL SCREENING BILAT W/ CAD
7 series · 7 of 7 positions shown · non-contrast
Comparison: Previous exam(s).

CLINICAL DATA: Screening.

EXAM:
DIGITAL SCREENING BILATERAL MAMMOGRAM WITH CAD

[L MLO (1 of 2)]
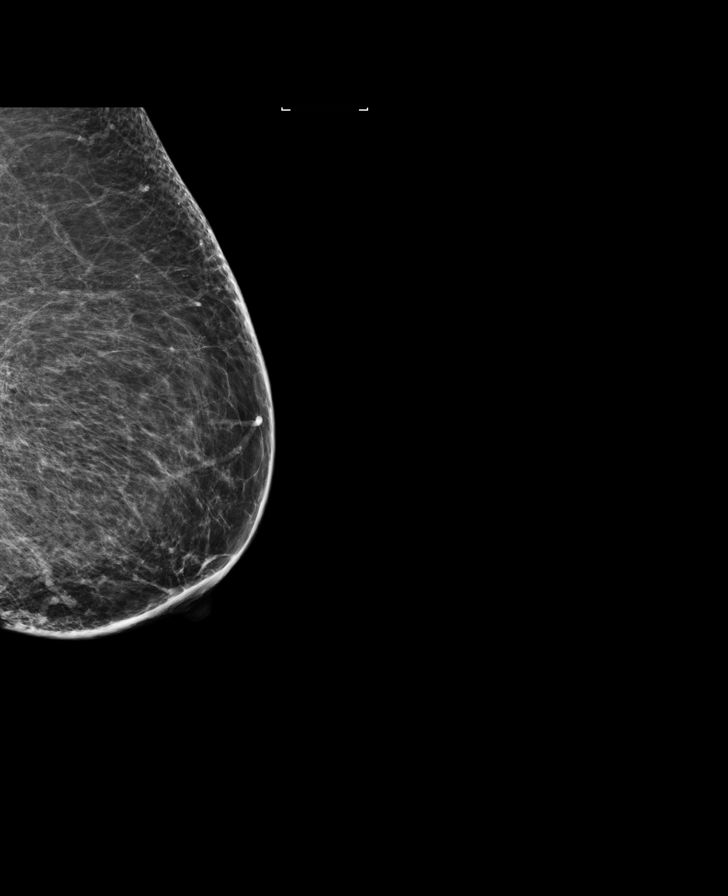

[L CC (1 of 2)]
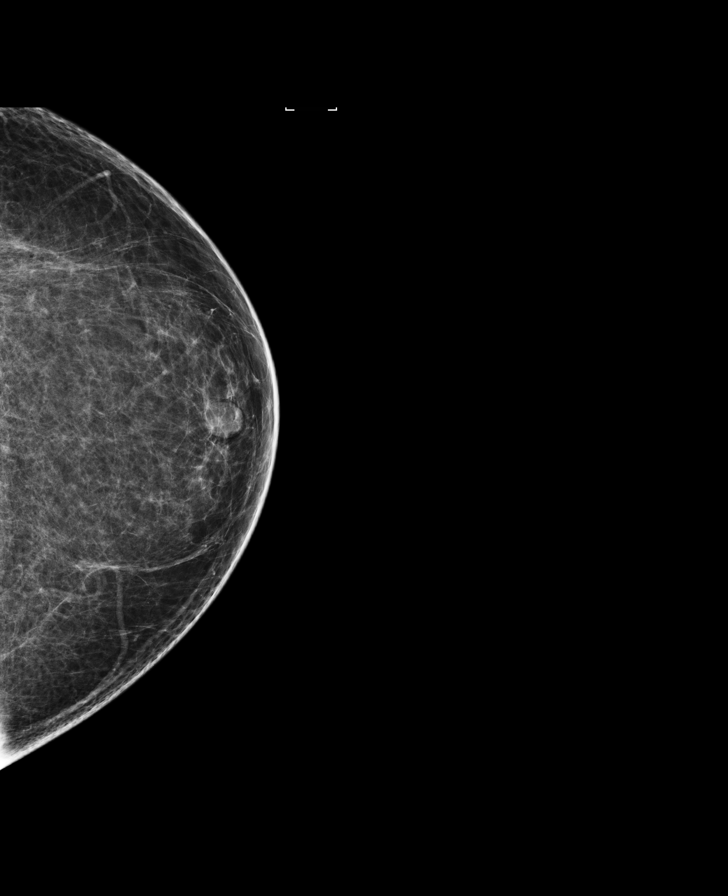

[R MLO (1 of 2)]
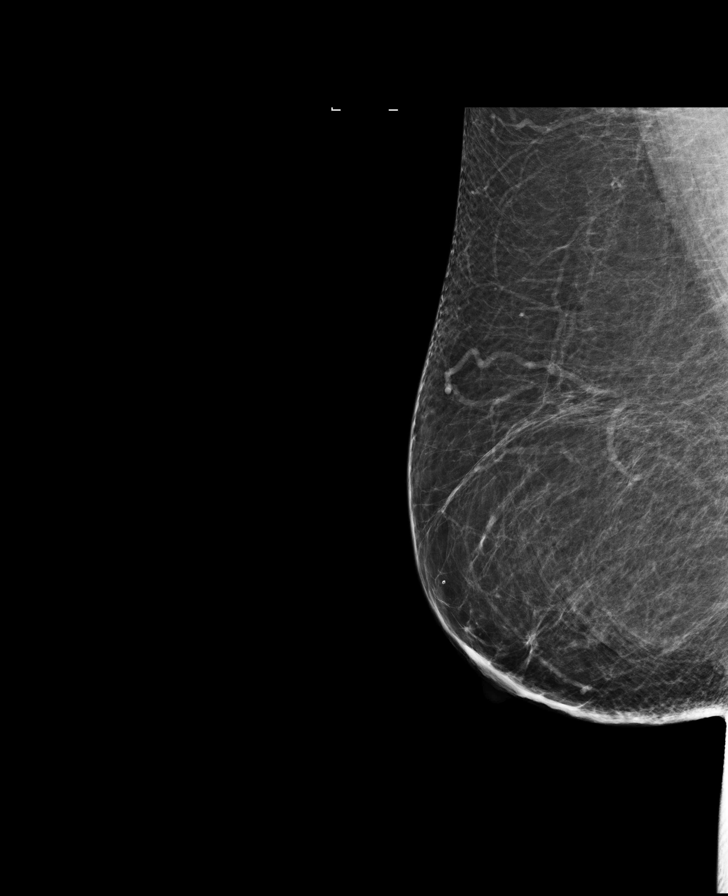

[R CC]
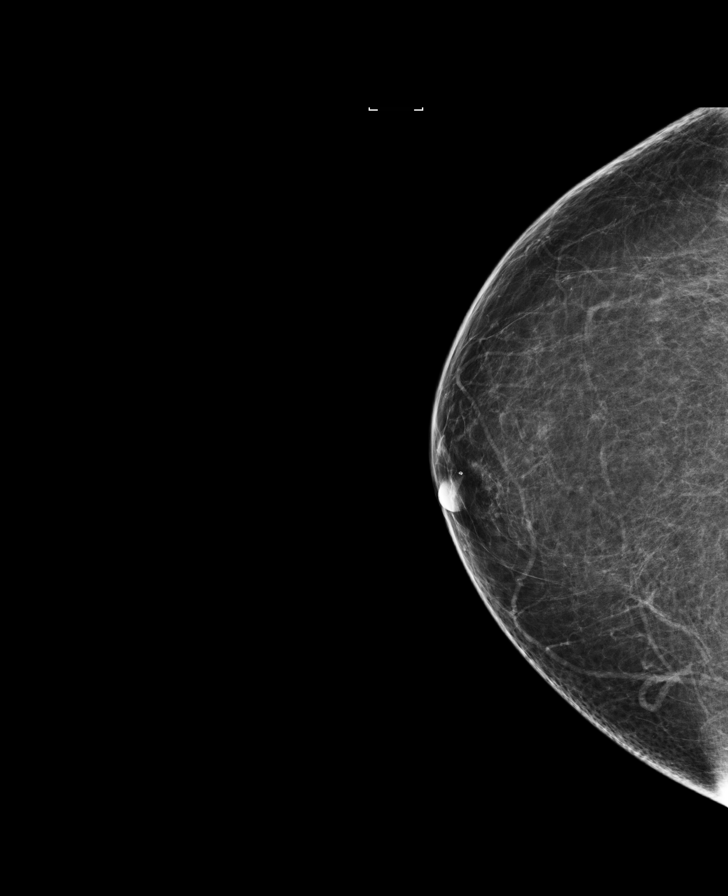

[L CC (2 of 2)]
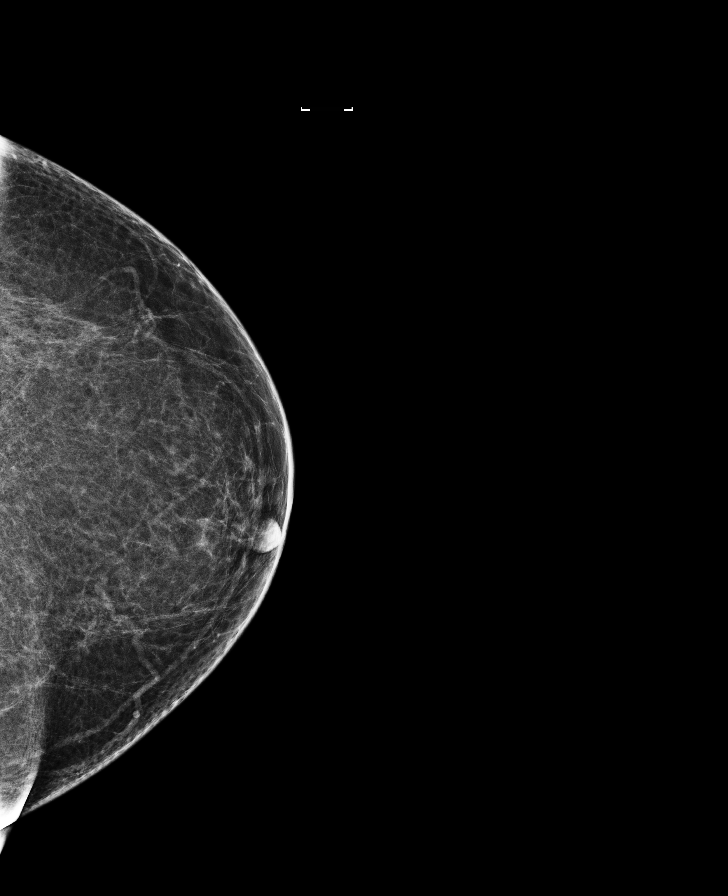

[L MLO (2 of 2)]
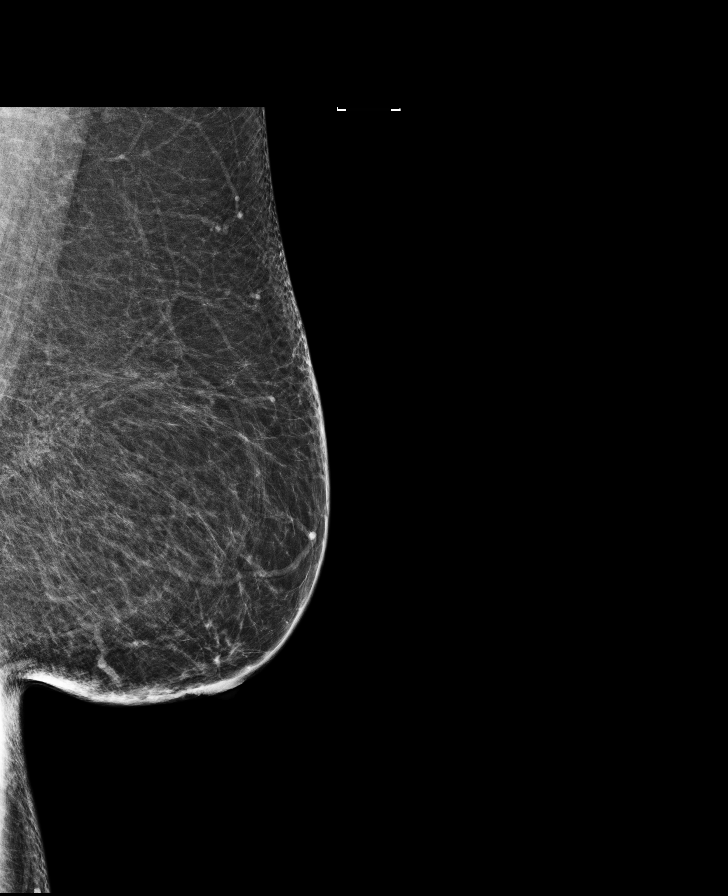

[R MLO (2 of 2)]
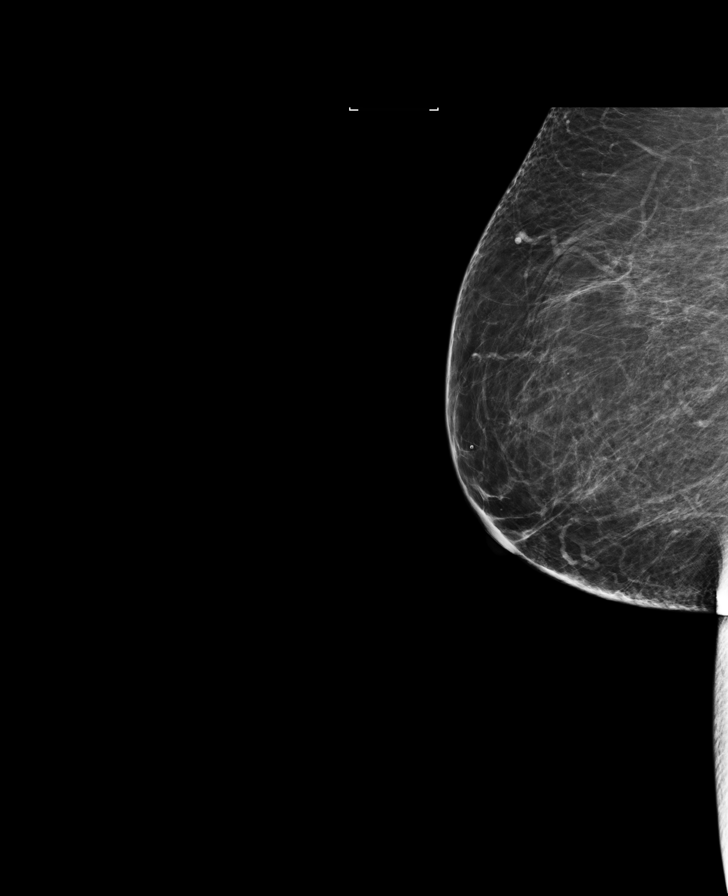

[7 of 7 positions shown; findings below may reference images not displayed]

ACR Breast Density Category b: There are scattered areas of
fibroglandular density.
FINDINGS: There are no findings suspicious for malignancy. Images were
processed with CAD.
IMPRESSION: No mammographic evidence of malignancy. A result letter of this
screening mammogram will be mailed directly to the patient.

RECOMMENDATION:
Screening mammogram in one year. (Code:AS-G-LCT)

BI-RADS CATEGORY  1: Negative.

## 2017-11-01 ENCOUNTER — Ambulatory Visit
Admission: RE | Admit: 2017-11-01 | Discharge: 2017-11-01 | Disposition: A | Discharge status: Home / Self Care | Payer: 59 | Source: Ambulatory Visit | Attending: Obstetrics and Gynecology | Admitting: Obstetrics and Gynecology

## 2017-11-01 DIAGNOSIS — Z1231 Encounter for screening mammogram for malignant neoplasm of breast: Secondary | ICD-10-CM | POA: Insufficient documentation

## 2017-12-29 NOTE — Progress Notes (Signed)
Cardiology Office Note  Date:  12/30/2017   ID:  [name], DOB 19-Jul-1971, MRN [id number]  PCP:  Kirk Ruths, MD   Chief Complaint  Patient presents with  . other    12 month follow up. Former Dr. Yvone Neu patient. "doing well." Meds reviewed by the pt. verbally.     HPI:  Rebecca Johnson is a 47 y.o. female with pmh of  HTN,  PFO noted on transesophageal echo OSA on CPAP CVA previous seen by Dr. Yvone Neu in 2017 Who presents for follow-up of her stroke, PFO, hypertension  Notes indicating she previously showed up to the emergency room with stroke symptoms 2017.  Blood pressure at that time 200/100 Prior to the event pressure had been running high but not that high She had complete workup including echo, transesophageal echo, carotid ultrasound, head scans, monitor  At that time was recommended she take aspirin 325 mg daily  In follow-up today she reports that she feels fine No chest pain. No shortness of breath.   Difficulty losing weight Eating the wrong foods Denies any significant shortness of breath No TIA or stroke symptoms Denies having any GERD symptoms  EKG personally reviewed by myself on todays visit Shows normal sinus rhythm rate 97 bpm no significant ST or T wave changes  Other past medical history reviewed with her on today's visit Carotid ultrasound March 2017 with no significant disease Previous event monitor with no A. Fib March 2017 Transesophageal echo March 2017 with no left atrial appendage thrombus There was a possible PFO with right-to-left shunt  PMH:   has a past medical history of Hypertension, Sleep apnea, and Stroke ([place]).  PSH:    Past Surgical History:  Procedure Laterality Date  . TEE WITHOUT CARDIOVERSION N/A 02/13/2016   Procedure: TRANSESOPHAGEAL ECHOCARDIOGRAM (TEE);  Surgeon: Wende Bushy, MD;  Location: [facility] ORS;  Service: Cardiovascular;  Laterality: N/A;  . WRIST SURGERY Right 2015    Current Outpatient  Medications  Medication Sig Dispense Refill  . amLODipine (NORVASC) 5 MG tablet Take 1 tablet (5 mg total) by mouth daily. 90 tablet 6  . aspirin 325 MG tablet Take 1 tablet (325 mg total) by mouth daily. 30 tablet 2  . atorvastatin (LIPITOR) 40 MG tablet Take 1 tablet (40 mg total) by mouth daily at 6 PM. 30 tablet 2  . metoprolol tartrate (LOPRESSOR) 25 MG tablet Take 1 tablet (25 mg total) by mouth 2 (two) times daily. 180 tablet 6   No current facility-administered medications for this visit.      Allergies:   Patient has no known allergies.   Social History:  The patient  reports that  has never smoked. she has never used smokeless tobacco. She reports that she does not drink alcohol or use drugs.   Family History:   family history includes Atrial fibrillation in her maternal grandmother; Breast cancer (age of onset: 58) in her maternal grandmother; Diabetes in her father and mother; Hypertension in her father and mother.    Review of Systems: Review of Systems  Constitutional: Negative.   Respiratory: Negative.   Cardiovascular: Negative.   Gastrointestinal: Negative.   Musculoskeletal: Negative.   Neurological: Negative.   Psychiatric/Behavioral: Negative.   All other systems reviewed and are negative.    PHYSICAL EXAM: VS:  BP 124/76 (BP Location: Left Wrist, Patient Position: Sitting, Cuff Size: Normal)   Pulse 97   Ht 5\' 2"  (1.575 m)   Wt 279 lb 8 oz (126.8  kg)   BMI 51.12 kg/m  , BMI Body mass index is 51.12 kg/m. GEN: Well nourished, well developed, in no acute distress  HEENT: normal  Neck: no JVD, carotid bruits, or masses Cardiac: RRR; no murmurs, rubs, or gallops,no edema  Respiratory:  clear to auscultation bilaterally, normal work of breathing GI: soft, nontender, nondistended, + BS MS: no deformity or atrophy  Skin: warm and dry, no rash Neuro:  Strength and sensation are intact Psych: euthymic mood, full affect    Recent Labs: No results found  for requested labs within last 8760 hours.    Lipid Panel Lab Results  Component Value Date   CHOL 157 02/03/2016   HDL 44 02/03/2016   LDLCALC 100 (H) 02/03/2016   TRIG 66 02/03/2016      Wt Readings from Last 3 Encounters:  12/30/17 279 lb 8 oz (126.8 kg)  11/16/16 247 lb 8 oz (112.3 kg)  03/27/16 256 lb 6.4 oz (116.3 kg)       ASSESSMENT AND PLAN:  Morbid obesity ([place]) - Plan: EKG 12-Lead We have encouraged continued exercise, careful diet management in an effort to lose weight.  PFO Seen on transesophageal echo Discussed aspirin versus Plavix and risk of GERD, stomach irritation She prefers to stay on aspirin  Cerebrovascular accident (CVA), unspecified mechanism ([place]) - Plan: EKG 12-Lead Discussed PFO Malignant hypertension - Plan: EKG 12-Lead Blood pressure is well controlled on today's visit. No changes made to the medications.  OSA (obstructive sleep apnea) - Plan: EKG 12-Lead Recommended weight loss, CPAP  Disposition:   F/U  12 months as needed   Total encounter time more than 25 minutes  Greater than 50% was spent in counseling and coordination of care with the patient    Orders Placed This Encounter  Procedures  . EKG 12-Lead     Signed, Esmond Plants, M.D., Ph.D. 12/30/2017  [facility] [facility], [place]

## 2017-12-30 ENCOUNTER — Ambulatory Visit: Payer: BLUE CROSS/BLUE SHIELD | Admitting: Cardiovascular Disease

## 2017-12-30 ENCOUNTER — Encounter: Payer: Self-pay | Admitting: Cardiovascular Disease

## 2017-12-30 DIAGNOSIS — I639 Cerebral infarction, unspecified: Secondary | ICD-10-CM

## 2017-12-30 DIAGNOSIS — G4733 Obstructive sleep apnea (adult) (pediatric): Secondary | ICD-10-CM | POA: Diagnosis not present

## 2017-12-30 DIAGNOSIS — I10 Essential (primary) hypertension: Secondary | ICD-10-CM

## 2017-12-30 NOTE — Patient Instructions (Addendum)
Medication Instructions:   No medication changes made  Research plavix, instead of aspirin  Labwork:  No new labs needed  Testing/Procedures:  No further testing at this time   Follow-Up: It was a pleasure seeing you in the office today. Please call us if you have new issues that need to be addressed before your next appt.  [phone]  Your physician wants you to follow-up in: 12 months as needed.  You will receive a reminder letter in the mail two months in advance. If you don't receive a letter, please call our office to schedule the follow-up appointment.  If you need a refill on your cardiac medications before your next appointment, please call your pharmacy.

## 2018-10-06 ENCOUNTER — Other Ambulatory Visit: Payer: Self-pay | Admitting: Obstetrics and Gynecology

## 2018-10-06 DIAGNOSIS — Z1231 Encounter for screening mammogram for malignant neoplasm of breast: Secondary | ICD-10-CM

## 2018-11-07 ENCOUNTER — Ambulatory Visit
Admission: RE | Admit: 2018-11-07 | Discharge: 2018-11-07 | Disposition: A | Discharge status: Home / Self Care | Payer: BLUE CROSS/BLUE SHIELD | Source: Ambulatory Visit | Attending: Obstetrics and Gynecology | Admitting: Obstetrics and Gynecology

## 2018-11-07 DIAGNOSIS — Z1231 Encounter for screening mammogram for malignant neoplasm of breast: Secondary | ICD-10-CM | POA: Diagnosis not present

## 2019-10-09 ENCOUNTER — Other Ambulatory Visit: Payer: Self-pay | Admitting: Obstetrics and Gynecology

## 2019-10-09 DIAGNOSIS — Z1231 Encounter for screening mammogram for malignant neoplasm of breast: Secondary | ICD-10-CM

## 2019-11-19 ENCOUNTER — Ambulatory Visit
Admission: RE | Admit: 2019-11-19 | Discharge: 2019-11-19 | Disposition: A | Discharge status: Home / Self Care | Payer: Managed Care, Other (non HMO) | Source: Ambulatory Visit | Attending: Obstetrics and Gynecology | Admitting: Obstetrics and Gynecology

## 2019-11-19 DIAGNOSIS — Z1231 Encounter for screening mammogram for malignant neoplasm of breast: Secondary | ICD-10-CM | POA: Diagnosis present

## 2020-05-20 ENCOUNTER — Other Ambulatory Visit: Payer: Self-pay | Admitting: Obstetrics and Gynecology

## 2020-05-20 DIAGNOSIS — Z1231 Encounter for screening mammogram for malignant neoplasm of breast: Secondary | ICD-10-CM

## 2020-11-25 ENCOUNTER — Other Ambulatory Visit: Payer: Self-pay

## 2020-11-25 ENCOUNTER — Ambulatory Visit
Admission: RE | Admit: 2020-11-25 | Discharge: 2020-11-25 | Disposition: A | Discharge status: Home / Self Care | Payer: Managed Care, Other (non HMO) | Source: Ambulatory Visit | Attending: Obstetrics and Gynecology | Admitting: Obstetrics and Gynecology

## 2020-11-25 DIAGNOSIS — Z1231 Encounter for screening mammogram for malignant neoplasm of breast: Secondary | ICD-10-CM | POA: Diagnosis not present

## 2021-08-17 ENCOUNTER — Other Ambulatory Visit: Payer: Self-pay | Admitting: Obstetrics and Gynecology

## 2021-08-17 DIAGNOSIS — Z1231 Encounter for screening mammogram for malignant neoplasm of breast: Secondary | ICD-10-CM

## 2021-08-31 ENCOUNTER — Encounter: Payer: Self-pay | Admitting: *Deleted

## 2021-09-01 ENCOUNTER — Ambulatory Visit: Payer: Managed Care, Other (non HMO) | Admitting: Anesthesiology

## 2021-09-01 ENCOUNTER — Encounter
Admission: RE | Disposition: A | Discharge status: Home / Self Care | Payer: Self-pay | Source: Ambulatory Visit | Attending: Gastroenterology

## 2021-09-01 ENCOUNTER — Ambulatory Visit
Admission: RE | Admit: 2021-09-01 | Discharge: 2021-09-01 | Disposition: A | Discharge status: Home / Self Care | Payer: Managed Care, Other (non HMO) | Source: Ambulatory Visit | Attending: Gastroenterology | Admitting: Gastroenterology

## 2021-09-01 ENCOUNTER — Encounter: Payer: Self-pay | Admitting: *Deleted

## 2021-09-01 DIAGNOSIS — Z7982 Long term (current) use of aspirin: Secondary | ICD-10-CM | POA: Diagnosis not present

## 2021-09-01 DIAGNOSIS — Z79899 Other long term (current) drug therapy: Secondary | ICD-10-CM | POA: Insufficient documentation

## 2021-09-01 DIAGNOSIS — E119 Type 2 diabetes mellitus without complications: Secondary | ICD-10-CM | POA: Diagnosis not present

## 2021-09-01 DIAGNOSIS — D125 Benign neoplasm of sigmoid colon: Secondary | ICD-10-CM | POA: Insufficient documentation

## 2021-09-01 DIAGNOSIS — K5730 Diverticulosis of large intestine without perforation or abscess without bleeding: Secondary | ICD-10-CM | POA: Diagnosis not present

## 2021-09-01 DIAGNOSIS — D124 Benign neoplasm of descending colon: Secondary | ICD-10-CM | POA: Insufficient documentation

## 2021-09-01 DIAGNOSIS — Z1211 Encounter for screening for malignant neoplasm of colon: Secondary | ICD-10-CM | POA: Diagnosis not present

## 2021-09-01 DIAGNOSIS — Z8673 Personal history of transient ischemic attack (TIA), and cerebral infarction without residual deficits: Secondary | ICD-10-CM | POA: Insufficient documentation

## 2021-09-01 DIAGNOSIS — K635 Polyp of colon: Secondary | ICD-10-CM | POA: Diagnosis not present

## 2021-09-01 DIAGNOSIS — D123 Benign neoplasm of transverse colon: Secondary | ICD-10-CM | POA: Diagnosis not present

## 2021-09-01 HISTORY — PX: COLONOSCOPY: SHX5424

## 2021-09-01 LAB — POCT PREGNANCY, URINE: Preg Test, Ur: NEGATIVE

## 2021-09-01 LAB — GLUCOSE, CAPILLARY: Glucose-Capillary: 86 mg/dL (ref 70–99)

## 2021-09-01 SURGERY — COLONOSCOPY
Anesthesia: General

## 2021-09-01 MED ORDER — ONDANSETRON HCL 4 MG/2ML IJ SOLN: 4.0000 mg | Freq: Once | INTRAMUSCULAR | Status: DC | PRN

## 2021-09-01 MED ORDER — ACETAMINOPHEN 160 MG/5ML PO SOLN
325.0000 mg | ORAL | Status: DC | PRN
  Filled 2021-09-01: qty 20.3

## 2021-09-01 MED ORDER — PROPOFOL 500 MG/50ML IV EMUL
INTRAVENOUS | Status: DC | PRN
  Administered 2021-09-01: 125 ug/kg/min via INTRAVENOUS

## 2021-09-01 MED ORDER — ACETAMINOPHEN 325 MG PO TABS: 650.0000 mg | ORAL_TABLET | Freq: Once | ORAL | Status: DC | PRN

## 2021-09-01 MED ORDER — SODIUM CHLORIDE 0.9 % IV SOLN
INTRAVENOUS | Status: DC
  Administered 2021-09-01: 20 mL/h via INTRAVENOUS

## 2021-09-01 MED ORDER — PROPOFOL 10 MG/ML IV BOLUS
INTRAVENOUS | Status: DC | PRN
  Administered 2021-09-01: 50 mg via INTRAVENOUS
  Administered 2021-09-01: 60 mg via INTRAVENOUS

## 2021-09-01 MED ORDER — LIDOCAINE HCL (CARDIAC) PF 100 MG/5ML IV SOSY
PREFILLED_SYRINGE | INTRAVENOUS | Status: DC | PRN
  Administered 2021-09-01: 40 mg via INTRAVENOUS

## 2021-09-01 NOTE — Transfer of Care (Signed)
Immediate Anesthesia Transfer of Care Note  Patient: Rebecca Johnson  Procedure(s) Performed: COLONOSCOPY  Patient Location: PACU  Anesthesia Type:General  Level of Consciousness: awake, alert  and oriented  Airway & Oxygen Therapy: Patient Spontanous Breathing  Post-op Assessment: Report given to RN and Post -op Vital signs reviewed and stable  Post vital signs: Reviewed and stable  Last Vitals:  Vitals Value Taken Time  BP    Temp    Pulse    Resp    SpO2      Last Pain:  Vitals:   09/01/21 0952  TempSrc: Temporal  PainSc: 0-No pain         Complications: No notable events documented.

## 2021-09-01 NOTE — H&P (Signed)
[facility] Pre-Procedure H&P   Patient ID: Rebecca Johnson is a 50 y.o. female.  Gastroenterology Provider: Annamaria Helling, DO  Referring Provider: Dr. Ouida Sills PCP: Kirk Ruths, MD  Date: 09/01/2021  HPI Ms. Rebecca Johnson is a 50 y.o. female who presents today for Colonoscopy for initial screening colonoscopy.  No current GI complaints. BM normal w/o constipation/diarrhea/melena/hematochezia.  Past Medical History:  Diagnosis Date   Hypertension    Sleep apnea    Stroke [facility])     Past Surgical History:  Procedure Laterality Date   TEE WITHOUT CARDIOVERSION N/A 02/13/2016   Procedure: TRANSESOPHAGEAL ECHOCARDIOGRAM (TEE);  Surgeon: Wende Bushy, MD;  Location: [facility] ORS;  Service: Cardiovascular;  Laterality: N/A;   WRIST SURGERY Right 2015    Family History No h/o GI disease or malignancy  Review of Systems  Constitutional:  Negative for activity change, appetite change, fatigue, fever and unexpected weight change.  HENT:  Negative for trouble swallowing and voice change.   Respiratory:  Negative for shortness of breath and wheezing.   Cardiovascular:  Negative for chest pain and palpitations.  Gastrointestinal:  Negative for abdominal distention, abdominal pain, anal bleeding, blood in stool, constipation, diarrhea, nausea, rectal pain and vomiting.  Musculoskeletal:  Negative for arthralgias and myalgias.  Skin:  Negative for color change and pallor.  Neurological:  Negative for dizziness, syncope and weakness.  Psychiatric/Behavioral:  Negative for confusion.   All other systems reviewed and are negative.   Medications No current facility-administered medications on file prior to encounter.   Current Outpatient Medications on File Prior to Encounter  Medication Sig Dispense Refill   amLODipine (NORVASC) 5 MG tablet Take 1 tablet (5 mg total) by mouth daily. 90 tablet 6   aspirin 325 MG tablet Take 1 tablet (325 mg total) by mouth daily.  30 tablet 2   atorvastatin (LIPITOR) 40 MG tablet Take 1 tablet (40 mg total) by mouth daily at 6 PM. 30 tablet 2   metoprolol tartrate (LOPRESSOR) 25 MG tablet Take 1 tablet (25 mg total) by mouth 2 (two) times daily. 180 tablet 6    Pertinent medications related to GI and procedure were reviewed by me with the patient prior to the procedure   Current Facility-Administered Medications:    0.9 %  sodium chloride infusion, , Intravenous, Continuous, Annamaria Helling, DO, Last Rate: 20 mL/hr at 09/01/21 1009, 20 mL/hr at 09/01/21 1009   acetaminophen (TYLENOL) tablet 650 mg, 650 mg, Oral, Once PRN **OR** acetaminophen (TYLENOL) 160 MG/5ML solution 325-650 mg, 325-650 mg, Oral, Q4H PRN, Darrin Nipper, MD   ondansetron [facility]) injection 4 mg, 4 mg, Intravenous, Once PRN, Darrin Nipper, MD  sodium chloride 20 mL/hr (09/01/21 1009)    acetaminophen **OR** acetaminophen (TYLENOL) oral liquid 160 mg/5 mL, ondansetron (ZOFRAN) IV   No Known Allergies Allergies were reviewed by me prior to the procedure  Objective    Vitals:   09/01/21 0952  BP: (!) 136/98  Pulse: (!) 102  Resp: 20  Temp: (!) 96.7 F (35.9 C)  TempSrc: Temporal  SpO2: 98%  Weight: 124.3 kg  Height: 5\' 2"  (1.575 m)     Physical Exam Vitals reviewed.  Constitutional:      General: She is not in acute distress.    Appearance: Normal appearance. She is obese. She is not ill-appearing, toxic-appearing or diaphoretic.  HENT:     Head: Normocephalic and atraumatic.     Nose: Nose normal.     Mouth/Throat:  Mouth: Mucous membranes are moist.     Pharynx: Oropharynx is clear.  Eyes:     General: No scleral icterus.    Extraocular Movements: Extraocular movements intact.  Cardiovascular:     Rate and Rhythm: Regular rhythm. Tachycardia present.     Heart sounds: Normal heart sounds. No murmur heard.   No friction rub. No gallop.  Pulmonary:     Effort: Pulmonary effort is normal. No respiratory  distress.     Breath sounds: Normal breath sounds. No wheezing, rhonchi or rales.  Abdominal:     General: Bowel sounds are normal. There is no distension.     Palpations: Abdomen is soft.     Tenderness: There is no abdominal tenderness. There is no guarding or rebound.  Musculoskeletal:     Cervical back: Neck supple.     Right lower leg: No edema.     Left lower leg: No edema.  Skin:    General: Skin is warm and dry.     Coloration: Skin is not jaundiced or pale.  Neurological:     General: No focal deficit present.     Mental Status: She is alert and oriented to person, place, and time. Mental status is at baseline.  Psychiatric:        Mood and Affect: Mood normal.        Behavior: Behavior normal.        Thought Content: Thought content normal.        Judgment: Judgment normal.     Assessment:  Rebecca Johnson is a 50 y.o. female  who presents today for Colonoscopy for initial screening colonoscopy.  Plan:  Colonoscopy with possible intervention today  Colonoscopy with possible biopsy, control of bleeding, polypectomy, and interventions as necessary has been discussed with the patient/patient representative. Informed consent was obtained from the patient/patient representative after explaining the indication, nature, and risks of the procedure including but not limited to death, bleeding, perforation, missed neoplasm/lesions, cardiorespiratory compromise, and reaction to medications. Opportunity for questions was given and appropriate answers were provided. Patient/patient representative has verbalized understanding is amenable to undergoing the procedure.   Annamaria Helling, DO  [facility]  Portions of the record may have been created with voice recognition software. Occasional wrong-word or 'sound-a-like' substitutions may have occurred due to the inherent limitations of voice recognition software.  Read the chart carefully and recognize, using  context, where substitutions may have occurred.

## 2021-09-01 NOTE — Anesthesia Postprocedure Evaluation (Signed)
Anesthesia Post Note  Patient: Rebecca Johnson  Procedure(s) Performed: COLONOSCOPY  Patient location during evaluation: PACU Anesthesia Type: General Level of consciousness: awake and alert, oriented and patient cooperative Pain management: pain level controlled Vital Signs Assessment: post-procedure vital signs reviewed and stable Respiratory status: spontaneous breathing, nonlabored ventilation and respiratory function stable Cardiovascular status: blood pressure returned to baseline and stable Postop Assessment: adequate PO intake Anesthetic complications: no   No notable events documented.   Last Vitals:  Vitals:   09/01/21 1205 09/01/21 1215  BP: 121/75 139/87  Pulse:    Resp:    Temp:    SpO2:      Last Pain:  Vitals:   09/01/21 1215  TempSrc:   PainSc: 0-No pain                 Darrin Nipper

## 2021-09-01 NOTE — Op Note (Addendum)
[facility] Patient Name: Rebecca Johnson Procedure Date: 09/01/2021 10:55 AM MRN: [id number] Account #: [id number] Date of Birth: [birth year] Admit Type: Outpatient Age: 50 Room: [facility] ROOM 1 Gender: Female Note Status: Finalized Instrument Name: Colonoscope [id number] Procedure:             Colonoscopy Indications:           Screening for colorectal malignant neoplasm Providers:             Rueben Bash, DO Referring MD:          Ocie Cornfield. Ouida Sills MD, MD (Referring MD) Medicines:             Monitored Anesthesia Care Complications:         No immediate complications. Estimated blood loss:                         Minimal. Procedure:             Pre-Anesthesia Assessment:                        - Prior to the procedure, a History and Physical was                         performed, and patient medications and allergies were                         reviewed. The patient is competent. The risks and                         benefits of the procedure and the sedation options and                         risks were discussed with the patient. All questions                         were answered and informed consent was obtained.                         Patient identification and proposed procedure were                         verified by the physician, the nurse, the anesthetist                         and the technician in the endoscopy suite. Mental                         Status Examination: alert and oriented. Airway                         Examination: normal oropharyngeal airway and neck                         mobility. Respiratory Examination: clear to                         auscultation. CV Examination: RRR, no murmurs, no S3  or S4. Prophylactic Antibiotics: The patient does not                         require prophylactic antibiotics. Prior                         Anticoagulants: The patient has taken no  previous                         anticoagulant or antiplatelet agents. ASA Grade                         Assessment: III - A patient with severe systemic                         disease. After reviewing the risks and benefits, the                         patient was deemed in satisfactory condition to                         undergo the procedure. The anesthesia plan was to use                         monitored anesthesia care (MAC). Immediately prior to                         administration of medications, the patient was                         re-assessed for adequacy to receive sedatives. The                         heart rate, respiratory rate, oxygen saturations,                         blood pressure, adequacy of pulmonary ventilation, and                         response to care were monitored throughout the                         procedure. The physical status of the patient was                         re-assessed after the procedure.                        After obtaining informed consent, the colonoscope was                         passed under direct vision. Throughout the procedure,                         the patient's blood pressure, pulse, and oxygen                         saturations were monitored continuously. The  Colonoscope was introduced through the anus and                         advanced to the the terminal ileum, with                         identification of the appendiceal orifice and IC                         valve. The colonoscopy was performed without                         difficulty. The patient tolerated the procedure well.                         The quality of the bowel preparation was evaluated                         using the BBPS [facility] Bowel Preparation Scale) with                         scores of: Right Colon = 3, Transverse Colon = 3 and                         Left Colon = 3 (entire mucosa seen well with no                          residual staining, small fragments of stool or opaque                         liquid). The total BBPS score equals 9. The terminal                         ileum, ileocecal valve, appendiceal orifice, and                         rectum were photographed. Findings:      The terminal ileum appeared normal.      Multiple small-mouthed diverticula were found in the recto-sigmoid colon       and sigmoid colon. Estimated blood loss: none.      A 2 to 3 mm polyp was found in the transverse colon. The polyp was       sessile. The polyp was removed with a cold biopsy forceps. Resection and       retrieval were complete. Estimated blood loss was minimal.      A 6 to 7 mm polyp was found in the transverse colon. The polyp was       sessile. The polyp was removed with a cold snare. Resection and       retrieval were complete. Estimated blood loss was minimal.      A 6 to 7 mm polyp was found in the descending colon. The polyp was       sessile. The polyp was removed with a cold snare. Resection and       retrieval were complete. Estimated blood loss was minimal.      Two sessile polyps were found in the descending colon. The polyps were 2  to 3 mm in size. These polyps were removed with a cold biopsy forceps.       Resection and retrieval were complete. Estimated blood loss was minimal.      A 6 to 7 mm polyp was found in the sigmoid colon. The polyp was sessile.       The polyp was removed with a cold snare. Resection and retrieval were       complete. Estimated blood loss was minimal.      The exam was otherwise without abnormality on direct and retroflexion       views. Impression:            - The examined portion of the ileum was normal.                        - Diverticulosis in the recto-sigmoid colon and in the                         sigmoid colon.                        - One 2 to 3 mm polyp in the transverse colon, removed                         with a cold biopsy  forceps. Resected and retrieved.                        - One 6 to 7 mm polyp in the transverse colon, removed                         with a cold snare. Resected and retrieved.                        - One 6 to 7 mm polyp in the descending colon, removed                         with a cold snare. Resected and retrieved.                        - Two 2 to 3 mm polyps in the descending colon,                         removed with a cold biopsy forceps. Resected and                         retrieved.                        - One 6 to 7 mm polyp in the sigmoid colon, removed                         with a cold snare. Resected and retrieved.                        - The examination was otherwise normal on direct and                         retroflexion views. Recommendation:        -  Discharge patient to home.                        - Resume previous diet.                        - Continue present medications.                        - No aspirin, ibuprofen, naproxen, or other                         non-steroidal anti-inflammatory drugs for 5 days after                         polyp removal.                        - Await pathology results.                        - Repeat colonoscopy for surveillance based on                         pathology results.                        - Return to referring physician as previously                         scheduled. Procedure Code(s):     --- Professional ---                        [phone], Colonoscopy, flexible; with removal of                         tumor(s), polyp(s), or other lesion(s) by snare                         technique                        [id number], 76, Colonoscopy, flexible; with biopsy, single                         or multiple Diagnosis Code(s):     --- Professional ---                        K63.5, Polyp of colon                        Z12.11, Encounter for screening for malignant neoplasm                         of colon                         K57.30, Diverticulosis of large intestine without                         perforation or abscess without bleeding CPT copyright 2019 American [employer]. All rights reserved. The codes documented in this report are  preliminary and upon coder review may  be revised to meet current compliance requirements. Attending Participation:      I personally performed the entire procedure. Volney American, DO Annamaria Helling DO, DO 09/01/2021 12:01:07 PM This report has been signed electronically. Number of Addenda: 0 Note Initiated On: 09/01/2021 10:55 AM Scope Withdrawal Time: 0 hours 24 minutes 19 seconds  Total Procedure Duration: 0 hours 28 minutes 39 seconds  Estimated Blood Loss:  Estimated blood loss was minimal.      [facility]

## 2021-09-01 NOTE — Anesthesia Preprocedure Evaluation (Signed)
Anesthesia Evaluation  Patient identified by MRN, date of birth, ID band Patient awake    Reviewed: Allergy & Precautions, NPO status , Patient's Chart, lab work & pertinent test results  History of Anesthesia Complications Negative for: history of anesthetic complications  Airway Mallampati: I   Neck ROM: Full    Dental  (+)    Pulmonary sleep apnea ,    Pulmonary exam normal breath sounds clear to auscultation       Cardiovascular hypertension, Normal cardiovascular exam Rhythm:Regular Rate:Normal     Neuro/Psych CVA (2017, no residual deficits)    GI/Hepatic negative GI ROS,   Endo/Other  diabetes, Type 2Class 3 obesity  Renal/GU negative Renal ROS     Musculoskeletal   Abdominal   Peds  Hematology negative hematology ROS (+)   Anesthesia Other Findings   Reproductive/Obstetrics                             Anesthesia Physical Anesthesia Plan  ASA: 3  Anesthesia Plan: General   Post-op Pain Management:    Induction: Intravenous  PONV Risk Score and Plan: Propofol infusion, TIVA and Treatment may vary due to age or medical condition  Airway Management Planned: Natural Airway  Additional Equipment:   Intra-op Plan:   Post-operative Plan:   Informed Consent: I have reviewed the patients History and Physical, chart, labs and discussed the procedure including the risks, benefits and alternatives for the proposed anesthesia with the patient or authorized representative who has indicated his/her understanding and acceptance.       Plan Discussed with: CRNA  Anesthesia Plan Comments:         Anesthesia Quick Evaluation

## 2021-09-01 NOTE — Interval H&P Note (Signed)
History and Physical Interval Note: Preprocedure H&P from 09/01/21  was reviewed and there was no interval change after seeing and examining the patient.  Written consent was obtained from the patient after discussion of risks, benefits, and alternatives. Patient has consented to proceed with Colonoscopy with possible intervention   09/01/2021 11:15 AM  Rebecca Johnson  has presented today for surgery, with the diagnosis of screening colonoscopy.  The various methods of treatment have been discussed with the patient and family. After consideration of risks, benefits and other options for treatment, the patient has consented to  Procedure(s): COLONOSCOPY (N/A) as a surgical intervention.  The patient's history has been reviewed, patient examined, no change in status, stable for surgery.  I have reviewed the patient's chart and labs.  Questions were answered to the patient's satisfaction.     Annamaria Helling

## 2021-09-04 ENCOUNTER — Encounter: Payer: Self-pay | Admitting: Gastroenterology

## 2021-09-04 LAB — SURGICAL PATHOLOGY

## 2021-12-07 ENCOUNTER — Other Ambulatory Visit: Payer: Self-pay

## 2021-12-07 ENCOUNTER — Ambulatory Visit
Admission: RE | Admit: 2021-12-07 | Discharge: 2021-12-07 | Disposition: A | Discharge status: Home / Self Care | Payer: Managed Care, Other (non HMO) | Source: Ambulatory Visit | Attending: Obstetrics and Gynecology | Admitting: Obstetrics and Gynecology

## 2021-12-07 DIAGNOSIS — Z1231 Encounter for screening mammogram for malignant neoplasm of breast: Secondary | ICD-10-CM | POA: Insufficient documentation

## 2022-10-29 ENCOUNTER — Other Ambulatory Visit: Payer: Self-pay | Admitting: Obstetrics and Gynecology

## 2022-10-29 DIAGNOSIS — Z1231 Encounter for screening mammogram for malignant neoplasm of breast: Secondary | ICD-10-CM

## 2022-12-10 ENCOUNTER — Ambulatory Visit
Admission: RE | Admit: 2022-12-10 | Discharge: 2022-12-10 | Disposition: A | Discharge status: Home / Self Care | Payer: Managed Care, Other (non HMO) | Source: Ambulatory Visit | Attending: Obstetrics and Gynecology | Admitting: Obstetrics and Gynecology

## 2022-12-10 DIAGNOSIS — Z1231 Encounter for screening mammogram for malignant neoplasm of breast: Secondary | ICD-10-CM | POA: Insufficient documentation

## 2023-11-14 ENCOUNTER — Other Ambulatory Visit: Payer: Self-pay | Admitting: Obstetrics and Gynecology

## 2023-11-14 DIAGNOSIS — Z1231 Encounter for screening mammogram for malignant neoplasm of breast: Secondary | ICD-10-CM

## 2024-01-03 ENCOUNTER — Ambulatory Visit
Admission: RE | Admit: 2024-01-03 | Discharge: 2024-01-03 | Disposition: A | Discharge status: Home / Self Care | Payer: Managed Care, Other (non HMO) | Source: Ambulatory Visit | Attending: Obstetrics and Gynecology | Admitting: Obstetrics and Gynecology

## 2024-01-03 DIAGNOSIS — Z1231 Encounter for screening mammogram for malignant neoplasm of breast: Secondary | ICD-10-CM | POA: Insufficient documentation

## 2024-09-27 ENCOUNTER — Encounter: Payer: Self-pay | Admitting: Gastroenterology

## 2024-09-27 NOTE — H&P (Signed)
 Pre-Procedure H&P   Patient ID: Rebecca Johnson is a 53 y.o. female.  Gastroenterology Provider: Elspeth Ozell Jungling, DO  Referring Provider: Dr. Lenon PCP: Lenon Layman ORN, MD  Date: 09/27/2024  HPI Ms. Rebecca Johnson is a 53 y.o. female who presents today for Colonoscopy for Personal history of colon polyps .  Last underwent colonoscopy in 2022 with diverticulosis, 4 tubular adenomas and 1 sessile serrated polyp On Rybelsus was been held for procedure  No family history of colon cancer or colon polyps   Past Medical History:  Diagnosis Date   Hypertension    Sleep apnea    Stroke [facility])     Past Surgical History:  Procedure Laterality Date   COLONOSCOPY N/A 09/01/2021   Procedure: COLONOSCOPY;  Surgeon: Jungling Elspeth Ozell, DO;  Location: [facility] ENDOSCOPY;  Service: Gastroenterology;  Laterality: N/A;   TEE WITHOUT CARDIOVERSION N/A 02/13/2016   Procedure: TRANSESOPHAGEAL ECHOCARDIOGRAM (TEE);  Surgeon: Elma Shelling, MD;  Location: [facility] ORS;  Service: Cardiovascular;  Laterality: N/A;   WRIST SURGERY Right 2015    Family History No h/o GI disease or malignancy  Review of Systems  Constitutional:  Negative for activity change, appetite change, chills, diaphoresis, fatigue, fever and unexpected weight change.  HENT:  Negative for trouble swallowing and voice change.   Respiratory:  Negative for shortness of breath and wheezing.   Cardiovascular:  Negative for chest pain, palpitations and leg swelling.  Gastrointestinal:  Negative for abdominal distention, abdominal pain, anal bleeding, blood in stool, constipation, diarrhea, nausea, rectal pain and vomiting.  Musculoskeletal:  Negative for arthralgias and myalgias.  Skin:  Negative for color change and pallor.  Neurological:  Negative for dizziness, syncope and weakness.  Psychiatric/Behavioral:  Negative for confusion.   All other systems reviewed and are negative.    Medications No current  facility-administered medications on file prior to encounter.   Current Outpatient Medications on File Prior to Encounter  Medication Sig Dispense Refill   amLODipine  (NORVASC ) 5 MG tablet Take 1 tablet (5 mg total) by mouth daily. 90 tablet 6   aspirin  325 MG tablet Take 1 tablet (325 mg total) by mouth daily. 30 tablet 2   atorvastatin  (LIPITOR) 40 MG tablet Take 1 tablet (40 mg total) by mouth daily at 6 PM. 30 tablet 2   metoprolol  tartrate (LOPRESSOR ) 25 MG tablet Take 1 tablet (25 mg total) by mouth 2 (two) times daily. 180 tablet 6    Pertinent medications related to GI and procedure were reviewed by me with the patient prior to the procedure  No current facility-administered medications for this encounter.  Current Outpatient Medications:    amLODipine  (NORVASC ) 5 MG tablet, Take 1 tablet (5 mg total) by mouth daily., Disp: 90 tablet, Rfl: 6   aspirin  325 MG tablet, Take 1 tablet (325 mg total) by mouth daily., Disp: 30 tablet, Rfl: 2   atorvastatin  (LIPITOR) 40 MG tablet, Take 1 tablet (40 mg total) by mouth daily at 6 PM., Disp: 30 tablet, Rfl: 2   metoprolol  tartrate (LOPRESSOR ) 25 MG tablet, Take 1 tablet (25 mg total) by mouth 2 (two) times daily., Disp: 180 tablet, Rfl: 6      No Known Allergies Allergies were reviewed by me prior to the procedure  Objective   There is no height or weight on file to calculate BMI. There were no vitals filed for this visit.  *** Physical Exam Vitals and nursing note reviewed.  Constitutional:  General: She is not in acute distress.    Appearance: Normal appearance. She is not ill-appearing, toxic-appearing or diaphoretic.  HENT:     Head: Normocephalic and atraumatic.     Nose: Nose normal.     Mouth/Throat:     Mouth: Mucous membranes are moist.     Pharynx: Oropharynx is clear.  Eyes:     General: No scleral icterus.    Extraocular Movements: Extraocular movements intact.  Cardiovascular:     Rate and Rhythm: Normal  rate and regular rhythm.     Heart sounds: Normal heart sounds. No murmur heard.    No friction rub. No gallop.  Pulmonary:     Effort: Pulmonary effort is normal. No respiratory distress.     Breath sounds: Normal breath sounds. No wheezing, rhonchi or rales.  Abdominal:     General: Bowel sounds are normal. There is no distension.     Palpations: Abdomen is soft.     Tenderness: There is no abdominal tenderness. There is no guarding or rebound.  Musculoskeletal:     Cervical back: Neck supple.     Right lower leg: No edema.     Left lower leg: No edema.  Skin:    General: Skin is warm and dry.     Coloration: Skin is not jaundiced or pale.  Neurological:     General: No focal deficit present.     Mental Status: She is alert and oriented to person, place, and time. Mental status is at baseline.  Psychiatric:        Mood and Affect: Mood normal.        Behavior: Behavior normal.        Thought Content: Thought content normal.        Judgment: Judgment normal.      Assessment:  Ms. Rebecca Johnson is a 53 y.o. female  who presents today for Colonoscopy for Personal history of colon polyps .  Plan:  Colonoscopy with possible intervention today  Colonoscopy with possible biopsy, control of bleeding, polypectomy, and interventions as necessary has been discussed with the patient/patient representative. Informed consent was obtained from the patient/patient representative after explaining the indication, nature, and risks of the procedure including but not limited to death, bleeding, perforation, missed neoplasm/lesions, cardiorespiratory compromise, and reaction to medications. Opportunity for questions was given and appropriate answers were provided. Patient/patient representative has verbalized understanding is amenable to undergoing the procedure.   Elspeth Ozell Jungling, DO  [facility]  Portions of the record may have been created with voice recognition  software. Occasional wrong-word or 'sound-a-like' substitutions may have occurred due to the inherent limitations of voice recognition software.  Read the chart carefully and recognize, using context, where substitutions may have occurred.

## 2024-09-28 ENCOUNTER — Encounter: Payer: Self-pay | Admitting: Gastroenterology

## 2024-09-28 ENCOUNTER — Ambulatory Visit
Admission: RE | Admit: 2024-09-28 | Discharge: 2024-09-28 | Disposition: A | Discharge status: Home / Self Care | Attending: Gastroenterology | Admitting: Gastroenterology

## 2024-09-28 ENCOUNTER — Encounter
Admission: RE | Disposition: A | Discharge status: Home / Self Care | Payer: Self-pay | Source: Home / Self Care | Attending: Gastroenterology

## 2024-09-28 ENCOUNTER — Ambulatory Visit: Admitting: Anesthesiology

## 2024-09-28 DIAGNOSIS — Z1211 Encounter for screening for malignant neoplasm of colon: Secondary | ICD-10-CM | POA: Diagnosis not present

## 2024-09-28 DIAGNOSIS — D123 Benign neoplasm of transverse colon: Secondary | ICD-10-CM | POA: Diagnosis not present

## 2024-09-28 DIAGNOSIS — D124 Benign neoplasm of descending colon: Secondary | ICD-10-CM | POA: Insufficient documentation

## 2024-09-28 DIAGNOSIS — E6689 Other obesity not elsewhere classified: Secondary | ICD-10-CM | POA: Insufficient documentation

## 2024-09-28 DIAGNOSIS — Z8673 Personal history of transient ischemic attack (TIA), and cerebral infarction without residual deficits: Secondary | ICD-10-CM | POA: Diagnosis not present

## 2024-09-28 DIAGNOSIS — Z79899 Other long term (current) drug therapy: Secondary | ICD-10-CM | POA: Diagnosis not present

## 2024-09-28 DIAGNOSIS — I10 Essential (primary) hypertension: Secondary | ICD-10-CM | POA: Insufficient documentation

## 2024-09-28 DIAGNOSIS — G4730 Sleep apnea, unspecified: Secondary | ICD-10-CM | POA: Insufficient documentation

## 2024-09-28 DIAGNOSIS — K640 First degree hemorrhoids: Secondary | ICD-10-CM | POA: Insufficient documentation

## 2024-09-28 DIAGNOSIS — D122 Benign neoplasm of ascending colon: Secondary | ICD-10-CM | POA: Diagnosis not present

## 2024-09-28 DIAGNOSIS — K5730 Diverticulosis of large intestine without perforation or abscess without bleeding: Secondary | ICD-10-CM | POA: Diagnosis not present

## 2024-09-28 DIAGNOSIS — Z6841 Body Mass Index (BMI) 40.0 and over, adult: Secondary | ICD-10-CM | POA: Insufficient documentation

## 2024-09-28 DIAGNOSIS — Z860101 Personal history of adenomatous and serrated colon polyps: Secondary | ICD-10-CM | POA: Diagnosis present

## 2024-09-28 HISTORY — PX: COLONOSCOPY: SHX5424

## 2024-09-28 HISTORY — PX: POLYPECTOMY: SHX149

## 2024-09-28 LAB — GLUCOSE, CAPILLARY: Glucose-Capillary: 101 mg/dL — ABNORMAL HIGH (ref 70–99)

## 2024-09-28 SURGERY — COLONOSCOPY
Anesthesia: General

## 2024-09-28 MED ORDER — PROPOFOL 10 MG/ML IV BOLUS
INTRAVENOUS | Status: DC | PRN
  Administered 2024-09-28 (×2): 50 mg via INTRAVENOUS

## 2024-09-28 MED ORDER — DEXMEDETOMIDINE HCL IN NACL 80 MCG/20ML IV SOLN
INTRAVENOUS | Status: DC | PRN
  Administered 2024-09-28: 12 ug via INTRAVENOUS
  Administered 2024-09-28: 8 ug via INTRAVENOUS

## 2024-09-28 MED ORDER — SODIUM CHLORIDE 0.9 % IV SOLN
INTRAVENOUS | Status: DC
  Administered 2024-09-28: 500 mL via INTRAVENOUS

## 2024-09-28 MED ORDER — LIDOCAINE HCL (CARDIAC) PF 100 MG/5ML IV SOSY
PREFILLED_SYRINGE | INTRAVENOUS | Status: DC | PRN
  Administered 2024-09-28: 100 mg via INTRAVENOUS

## 2024-09-28 MED ORDER — PROPOFOL 500 MG/50ML IV EMUL
INTRAVENOUS | Status: DC | PRN
  Administered 2024-09-28: 75 ug/kg/min via INTRAVENOUS

## 2024-09-28 NOTE — Transfer of Care (Signed)
 Immediate Anesthesia Transfer of Care Note  Patient: Rebecca Johnson  Procedure(s) Performed: COLONOSCOPY POLYPECTOMY, INTESTINE  Patient Location: PACU  Anesthesia Type:General  Level of Consciousness: sedated  Airway & Oxygen Therapy: Patient Spontanous Breathing  Post-op Assessment: Report given to RN and Post -op Vital signs reviewed and stable  Post vital signs: Reviewed and stable  Last Vitals:  Vitals Value Taken Time  BP 86/56 09/28/24 09:20  Temp 35.8 C 09/28/24 09:20  Pulse 81 09/28/24 09:20  Resp 12 09/28/24 09:20  SpO2 96 % 09/28/24 09:20  Vitals shown include unfiled device data.  Last Pain:  Vitals:   09/28/24 0920  TempSrc: Tympanic  PainSc: Asleep         Complications: No notable events documented.

## 2024-09-28 NOTE — Op Note (Signed)
 [facility] Gastroenterology Patient Name: Rebecca Johnson Procedure Date: 09/28/2024 8:25 AM MRN: [id number] Account #: [id number] Date of Birth: April 21, 1971 Admit Type: Outpatient Age: 53 Room: [facility] ENDO ROOM 2 Gender: Female Note Status: Finalized Instrument Name: Colon Scope [id number] Procedure:             Colonoscopy Indications:           High risk colon cancer surveillance: Personal history                         of colonic polyps Providers:             Elspeth Ozell Onita ROSALEA, DO Referring MD:          Layman ORN. Lenon MD, MD (Referring MD) Medicines:             Monitored Anesthesia Care Complications:         No immediate complications. Estimated blood loss:                         Minimal. Procedure:             Pre-Anesthesia Assessment:                        - Prior to the procedure, a History and Physical was                         performed, and patient medications and allergies were                         reviewed. The patient is competent. The risks and                         benefits of the procedure and the sedation options and                         risks were discussed with the patient. All questions                         were answered and informed consent was obtained.                         Patient identification and proposed procedure were                         verified by the physician, the nurse, the anesthetist                         and the technician in the endoscopy suite. Mental                         Status Examination: alert and oriented. Airway                         Examination: normal oropharyngeal airway and neck                         mobility. Respiratory Examination: clear to  auscultation. CV Examination: RRR, no murmurs, no S3                         or S4. Prophylactic Antibiotics: The patient does not                         require prophylactic antibiotics. Prior                          Anticoagulants: The patient has taken no anticoagulant                         or antiplatelet agents. ASA Grade Assessment: III - A                         patient with severe systemic disease. After reviewing                         the risks and benefits, the patient was deemed in                         satisfactory condition to undergo the procedure. The                         anesthesia plan was to use monitored anesthesia care                         (MAC). Immediately prior to administration of                         medications, the patient was re-assessed for adequacy                         to receive sedatives. The heart rate, respiratory                         rate, oxygen saturations, blood pressure, adequacy of                         pulmonary ventilation, and response to care were                         monitored throughout the procedure. The physical                         status of the patient was re-assessed after the                         procedure.                        After obtaining informed consent, the colonoscope was                         passed under direct vision. Throughout the procedure,                         the patient's blood pressure, pulse, and oxygen  saturations were monitored continuously. The                         Colonoscope was introduced through the anus and                         advanced to the the cecum, identified by appendiceal                         orifice and ileocecal valve. The colonoscopy was                         performed without difficulty. The patient tolerated                         the procedure well. The quality of the bowel                         preparation was evaluated using the BBPS [facility] Bowel                         Preparation Scale) with scores of: Right Colon = 2                         (minor amount of residual staining, small fragments of                         stool  and/or opaque liquid, but mucosa seen well),                         Transverse Colon = 3 (entire mucosa seen well with no                         residual staining, small fragments of stool or opaque                         liquid) and Left Colon = 3 (entire mucosa seen well                         with no residual staining, small fragments of stool or                         opaque liquid). The total BBPS score equals 8. The                         quality of the bowel preparation was excellent. The                         ileocecal valve, appendiceal orifice, and rectum were                         photographed. Findings:      The perianal and digital rectal examinations were normal. Pertinent       negatives include normal sphincter tone.      Multiple small-mouthed diverticula were found in the sigmoid colon.       Estimated blood loss: none.  Non-bleeding internal hemorrhoids were found during retroflexion. The       hemorrhoids were Grade I (internal hemorrhoids that do not prolapse).      Four sessile polyps were found in the descending colon and transverse       colon. The polyps were 1 to 2 mm in size. These polyps were removed with       a jumbo cold forceps. Resection and retrieval were complete. Estimated       blood loss was minimal.      Two sessile polyps were found in the transverse colon and ascending       colon. The polyps were 3 to 4 mm in size. These polyps were removed with       a cold snare. Resection and retrieval were complete. Estimated blood       loss was minimal.      The exam was otherwise without abnormality on direct and retroflexion       views. Impression:            - Diverticulosis in the sigmoid colon.                        - Non-bleeding internal hemorrhoids.                        - Four 1 to 2 mm polyps in the descending colon and in                         the transverse colon, removed with a jumbo cold                         forceps.  Resected and retrieved.                        - Two 3 to 4 mm polyps in the transverse colon and in                         the ascending colon, removed with a cold snare.                         Resected and retrieved.                        - The examination was otherwise normal on direct and                         retroflexion views. Recommendation:        - Patient has a contact number available for                         emergencies. The signs and symptoms of potential                         delayed complications were discussed with the patient.                         Return to normal activities tomorrow. Written                         discharge instructions  were provided to the patient.                        - Discharge patient to home.                        - Resume previous diet.                        - Continue present medications.                        - No ibuprofen, naproxen, or other non-steroidal                         anti-inflammatory drugs for 5 days after polyp removal.                        - Await pathology results.                        - Repeat colonoscopy for surveillance based on                         pathology results.                        - Return to referring physician as previously                         scheduled.                        - The findings and recommendations were discussed with                         the patient. Procedure Code(s):     --- Professional ---                        [phone], Colonoscopy, flexible; with removal of                         tumor(s), polyp(s), or other lesion(s) by snare                         technique                        45380, 59, Colonoscopy, flexible; with biopsy, single                         or multiple Diagnosis Code(s):     --- Professional ---                        Z86.010, Personal history of colonic polyps                        K64.0, First degree hemorrhoids                         D12.4, Benign neoplasm of descending colon  D12.3, Benign neoplasm of transverse colon (hepatic                         flexure or splenic flexure)                        D12.2, Benign neoplasm of ascending colon                        K57.30, Diverticulosis of large intestine without                         perforation or abscess without bleeding CPT copyright 2022 American [employer] Association. All rights reserved. The codes documented in this report are preliminary and upon coder review may  be revised to meet current compliance requirements. Attending Participation:      I personally performed the entire procedure. Elspeth Jungling, DO Elspeth Ozell Jungling DO, DO 09/28/2024 9:26:08 AM This report has been signed electronically. Number of Addenda: 0 Note Initiated On: 09/28/2024 8:25 AM Scope Withdrawal Time: 0 hours 17 minutes 37 seconds  Total Procedure Duration: 0 hours 22 minutes 12 seconds  Estimated Blood Loss:  Estimated blood loss was minimal.      [facility]

## 2024-09-28 NOTE — Anesthesia Preprocedure Evaluation (Signed)
 Anesthesia Evaluation  Patient identified by MRN, date of birth, ID band Patient awake    Reviewed: Allergy & Precautions, NPO status , Patient's Chart, lab work & pertinent test results  Airway Mallampati: III  TM Distance: >3 FB Neck ROM: full    Dental  (+) Teeth Intact   Pulmonary neg pulmonary ROS, sleep apnea and Continuous Positive Airway Pressure Ventilation    Pulmonary exam normal        Cardiovascular Exercise Tolerance: Good hypertension, Pt. on medications negative cardio ROS Normal cardiovascular exam Rhythm:Regular Rate:Normal     Neuro/Psych CVA, No Residual Symptoms negative neurological ROS  negative psych ROS   GI/Hepatic negative GI ROS, Neg liver ROS,,,  Endo/Other  negative endocrine ROS  Class 4 obesity  Renal/GU negative Renal ROS  negative genitourinary   Musculoskeletal negative musculoskeletal ROS (+)    Abdominal  (+) + obese  Peds negative pediatric ROS (+)  Hematology negative hematology ROS (+)   Anesthesia Other Findings Past Medical History: No date: Hypertension No date: Sleep apnea No date: Stroke [facility])  Past Surgical History: 09/01/2021: COLONOSCOPY; N/A     Comment:  Procedure: COLONOSCOPY;  Surgeon: Onita Elspeth Sharper,              DO;  Location: [facility] ENDOSCOPY;  Service:               Gastroenterology;  Laterality: N/A; 02/13/2016: TEE WITHOUT CARDIOVERSION; N/A     Comment:  Procedure: TRANSESOPHAGEAL ECHOCARDIOGRAM (TEE);                Surgeon: Elma Shelling, MD;  Location: [facility] ORS;  Service:              Cardiovascular;  Laterality: N/A; 2015: WRIST SURGERY; Right  BMI    Body Mass Index: 49.49 kg/m      Reproductive/Obstetrics negative OB ROS                              Anesthesia Physical Anesthesia Plan  ASA: 3  Anesthesia Plan: General   Post-op Pain Management:    Induction: Intravenous  PONV Risk Score and Plan:  Propofol  infusion and TIVA  Airway Management Planned: Natural Airway and Nasal Cannula  Additional Equipment:   Intra-op Plan:   Post-operative Plan:   Informed Consent: I have reviewed the patients History and Physical, chart, labs and discussed the procedure including the risks, benefits and alternatives for the proposed anesthesia with the patient or authorized representative who has indicated his/her understanding and acceptance.     Dental Advisory Given  Plan Discussed with: CRNA  Anesthesia Plan Comments:         Anesthesia Quick Evaluation

## 2024-09-28 NOTE — Interval H&P Note (Signed)
 History and Physical Interval Note: Preprocedure H&P from 09/28/24  was reviewed and there was no interval change after seeing and examining the patient.  Written consent was obtained from the patient after discussion of risks, benefits, and alternatives. Patient has consented to proceed with Colonoscopy with possible intervention   09/28/2024 8:45 AM  Rebecca Johnson  has presented today for surgery, with the diagnosis of History of colon polyps (Z86.0100).  The various methods of treatment have been discussed with the patient and family. After consideration of risks, benefits and other options for treatment, the patient has consented to  Procedure(s): COLONOSCOPY (N/A) as a surgical intervention.  The patient's history has been reviewed, patient examined, no change in status, stable for surgery.  I have reviewed the patient's chart and labs.  Questions were answered to the patient's satisfaction.     Elspeth Ozell Jungling

## 2024-09-28 NOTE — Anesthesia Postprocedure Evaluation (Signed)
 Anesthesia Post Note  Patient: Rebecca Johnson  Procedure(s) Performed: COLONOSCOPY POLYPECTOMY, INTESTINE  Patient location during evaluation: PACU Anesthesia Type: General Level of consciousness: awake Pain management: satisfactory to patient Vital Signs Assessment: post-procedure vital signs reviewed and stable Respiratory status: spontaneous breathing Cardiovascular status: stable Anesthetic complications: no   No notable events documented.   Last Vitals:  Vitals:   09/28/24 0930 09/28/24 0940  BP: (!) 90/56 103/60  Pulse: 72 71  Resp: 18 13  Temp:    SpO2: 97% 99%    Last Pain:  Vitals:   09/28/24 0940  TempSrc:   PainSc: 0-No pain                 VAN STAVEREN,[name]

## 2024-12-03 ENCOUNTER — Other Ambulatory Visit: Payer: Self-pay | Admitting: Obstetrics and Gynecology

## 2024-12-03 DIAGNOSIS — Z1231 Encounter for screening mammogram for malignant neoplasm of breast: Secondary | ICD-10-CM

## 2025-01-08 ENCOUNTER — Encounter
